# Patient Record
Sex: Female | Born: 1955 | Race: White | Hispanic: Yes | Marital: Single | Smoking: Never smoker
Health system: Southern US, Community
[De-identification: ages and names within clinical notes are randomized; demographics above are authoritative.]

## PROBLEM LIST (undated history)

## (undated) DIAGNOSIS — E785 Hyperlipidemia, unspecified: Secondary | ICD-10-CM

## (undated) DIAGNOSIS — I729 Aneurysm of unspecified site: Secondary | ICD-10-CM

## (undated) DIAGNOSIS — J45909 Unspecified asthma, uncomplicated: Secondary | ICD-10-CM

## (undated) DIAGNOSIS — E079 Disorder of thyroid, unspecified: Secondary | ICD-10-CM

## (undated) HISTORY — PX: THYROIDECTOMY: SHX17

## (undated) HISTORY — DX: Unspecified asthma, uncomplicated: J45.909

## (undated) HISTORY — PX: ABDOMINAL HYSTERECTOMY: SHX81

## (undated) HISTORY — PX: BLADDER SURGERY: SHX569

## (undated) HISTORY — DX: Hyperlipidemia, unspecified: E78.5

## (undated) HISTORY — DX: Disorder of thyroid, unspecified: E07.9

## (undated) HISTORY — DX: Aneurysm of unspecified site: I72.9

---

## 2004-08-29 ENCOUNTER — Ambulatory Visit: Payer: Self-pay | Admitting: Family Medicine

## 2004-08-30 ENCOUNTER — Ambulatory Visit (HOSPITAL_COMMUNITY): Admission: RE | Admit: 2004-08-30 | Discharge: 2004-08-30 | Payer: Self-pay | Admitting: Family Medicine

## 2004-09-26 ENCOUNTER — Ambulatory Visit: Payer: Self-pay | Admitting: Family Medicine

## 2004-10-09 ENCOUNTER — Ambulatory Visit: Payer: Self-pay | Admitting: *Deleted

## 2004-11-20 ENCOUNTER — Encounter (INDEPENDENT_AMBULATORY_CARE_PROVIDER_SITE_OTHER): Payer: Self-pay | Admitting: Family Medicine

## 2004-11-20 LAB — CONVERTED CEMR LAB: Pap Smear: NORMAL

## 2004-11-29 ENCOUNTER — Ambulatory Visit: Payer: Self-pay | Admitting: Family Medicine

## 2004-12-10 ENCOUNTER — Ambulatory Visit (HOSPITAL_COMMUNITY): Admission: RE | Admit: 2004-12-10 | Discharge: 2004-12-10 | Payer: Self-pay | Admitting: Family Medicine

## 2004-12-11 ENCOUNTER — Ambulatory Visit (HOSPITAL_COMMUNITY): Admission: RE | Admit: 2004-12-11 | Discharge: 2004-12-11 | Payer: Self-pay | Admitting: Family Medicine

## 2005-01-01 ENCOUNTER — Ambulatory Visit: Payer: Self-pay | Admitting: Family Medicine

## 2005-06-18 ENCOUNTER — Ambulatory Visit: Payer: Self-pay | Admitting: Family Medicine

## 2005-06-27 ENCOUNTER — Ambulatory Visit: Payer: Self-pay | Admitting: Internal Medicine

## 2005-07-17 ENCOUNTER — Ambulatory Visit: Payer: Self-pay | Admitting: Obstetrics and Gynecology

## 2005-07-24 ENCOUNTER — Ambulatory Visit: Payer: Self-pay | Admitting: Obstetrics and Gynecology

## 2005-07-24 ENCOUNTER — Encounter (INDEPENDENT_AMBULATORY_CARE_PROVIDER_SITE_OTHER): Payer: Self-pay | Admitting: *Deleted

## 2005-07-24 ENCOUNTER — Ambulatory Visit (HOSPITAL_COMMUNITY): Admission: RE | Admit: 2005-07-24 | Discharge: 2005-07-24 | Payer: Self-pay | Admitting: Obstetrics and Gynecology

## 2005-08-14 ENCOUNTER — Ambulatory Visit: Payer: Self-pay | Admitting: *Deleted

## 2005-08-14 ENCOUNTER — Ambulatory Visit: Payer: Self-pay | Admitting: Family Medicine

## 2005-09-18 ENCOUNTER — Ambulatory Visit: Payer: Self-pay | Admitting: Family Medicine

## 2006-02-24 ENCOUNTER — Ambulatory Visit: Payer: Self-pay | Admitting: Family Medicine

## 2006-03-06 ENCOUNTER — Ambulatory Visit (HOSPITAL_COMMUNITY): Admission: RE | Admit: 2006-03-06 | Discharge: 2006-03-06 | Payer: Self-pay | Admitting: Family Medicine

## 2006-04-03 ENCOUNTER — Ambulatory Visit (HOSPITAL_COMMUNITY): Admission: RE | Admit: 2006-04-03 | Discharge: 2006-04-03 | Payer: Self-pay | Admitting: Family Medicine

## 2006-06-16 ENCOUNTER — Ambulatory Visit: Payer: Self-pay | Admitting: Family Medicine

## 2006-06-17 ENCOUNTER — Encounter (INDEPENDENT_AMBULATORY_CARE_PROVIDER_SITE_OTHER): Payer: Self-pay | Admitting: Family Medicine

## 2006-06-17 LAB — CONVERTED CEMR LAB: TSH: 1.543 microintl units/mL

## 2006-12-09 ENCOUNTER — Encounter (INDEPENDENT_AMBULATORY_CARE_PROVIDER_SITE_OTHER): Payer: Self-pay | Admitting: Family Medicine

## 2006-12-09 DIAGNOSIS — D4959 Neoplasm of unspecified behavior of other genitourinary organ: Secondary | ICD-10-CM | POA: Insufficient documentation

## 2006-12-09 DIAGNOSIS — E039 Hypothyroidism, unspecified: Secondary | ICD-10-CM | POA: Insufficient documentation

## 2006-12-10 DIAGNOSIS — F1520 Other stimulant dependence, uncomplicated: Secondary | ICD-10-CM | POA: Insufficient documentation

## 2006-12-10 DIAGNOSIS — E8941 Symptomatic postprocedural ovarian failure: Secondary | ICD-10-CM

## 2007-01-07 ENCOUNTER — Encounter (INDEPENDENT_AMBULATORY_CARE_PROVIDER_SITE_OTHER): Payer: Self-pay | Admitting: *Deleted

## 2007-02-13 ENCOUNTER — Ambulatory Visit: Payer: Self-pay | Admitting: Family Medicine

## 2007-02-13 LAB — CONVERTED CEMR LAB
Glucose, Bld: 84 mg/dL (ref 70–99)
Potassium: 4.4 meq/L (ref 3.5–5.3)
Sodium: 142 meq/L (ref 135–145)
TSH: 6.211 microintl units/mL — ABNORMAL HIGH (ref 0.350–5.50)

## 2007-04-23 HISTORY — PX: CYSTOCELE REPAIR: SHX163

## 2007-04-27 ENCOUNTER — Ambulatory Visit: Payer: Self-pay | Admitting: Family Medicine

## 2007-04-27 LAB — CONVERTED CEMR LAB: TSH: 1.898 microintl units/mL (ref 0.350–5.50)

## 2008-03-24 ENCOUNTER — Ambulatory Visit: Payer: Self-pay | Admitting: Family Medicine

## 2008-03-24 LAB — CONVERTED CEMR LAB
ALT: 21 units/L (ref 0–35)
AST: 18 units/L (ref 0–37)
Albumin: 4 g/dL (ref 3.5–5.2)
CO2: 23 meq/L (ref 19–32)
Calcium: 9.1 mg/dL (ref 8.4–10.5)
Cholesterol: 204 mg/dL — ABNORMAL HIGH (ref 0–200)
Creatinine, Ser: 0.5 mg/dL (ref 0.40–1.20)
Free T4: 1.77 ng/dL (ref 0.89–1.80)
TSH: 0.082 microintl units/mL — ABNORMAL LOW (ref 0.350–4.50)
Total Bilirubin: 0.5 mg/dL (ref 0.3–1.2)
Triglycerides: 82 mg/dL (ref <150)
VLDL: 16 mg/dL (ref 0–40)
Vit D, 1,25-Dihydroxy: 33 (ref 30–89)

## 2008-05-31 ENCOUNTER — Ambulatory Visit: Payer: Self-pay | Admitting: Family Medicine

## 2008-06-03 ENCOUNTER — Ambulatory Visit (HOSPITAL_COMMUNITY): Admission: RE | Admit: 2008-06-03 | Discharge: 2008-06-03 | Payer: Self-pay | Admitting: Internal Medicine

## 2008-09-06 ENCOUNTER — Ambulatory Visit: Payer: Self-pay | Admitting: Family Medicine

## 2009-01-30 ENCOUNTER — Ambulatory Visit: Payer: Self-pay | Admitting: Family Medicine

## 2009-02-24 ENCOUNTER — Ambulatory Visit: Payer: Self-pay | Admitting: Family Medicine

## 2009-03-14 ENCOUNTER — Ambulatory Visit: Payer: Self-pay | Admitting: Internal Medicine

## 2009-04-20 ENCOUNTER — Ambulatory Visit: Payer: Self-pay | Admitting: Family Medicine

## 2009-05-17 ENCOUNTER — Ambulatory Visit: Payer: Self-pay | Admitting: Obstetrics and Gynecology

## 2009-05-31 ENCOUNTER — Ambulatory Visit: Payer: Self-pay | Admitting: Obstetrics and Gynecology

## 2009-05-31 ENCOUNTER — Encounter: Payer: Self-pay | Admitting: Obstetrics & Gynecology

## 2009-05-31 LAB — CONVERTED CEMR LAB
BUN: 18 mg/dL (ref 6–23)
Creatinine, Ser: 0.59 mg/dL (ref 0.40–1.20)

## 2009-06-02 ENCOUNTER — Ambulatory Visit (HOSPITAL_COMMUNITY): Admission: RE | Admit: 2009-06-02 | Discharge: 2009-06-02 | Payer: Self-pay | Admitting: Obstetrics & Gynecology

## 2009-06-28 ENCOUNTER — Ambulatory Visit: Payer: Self-pay | Admitting: Obstetrics & Gynecology

## 2009-07-17 ENCOUNTER — Ambulatory Visit: Payer: Self-pay | Admitting: Family Medicine

## 2009-07-17 LAB — CONVERTED CEMR LAB
Alkaline Phosphatase: 78 units/L (ref 39–117)
Chloride: 104 meq/L (ref 96–112)
Cholesterol: 232 mg/dL — ABNORMAL HIGH (ref 0–200)
Creatinine, Ser: 0.52 mg/dL (ref 0.40–1.20)
Free T4: 1.23 ng/dL (ref 0.80–1.80)
HDL: 58 mg/dL (ref >39)
TSH: 0.525 microintl units/mL (ref 0.350–4.500)
Total CHOL/HDL Ratio: 4
Triglycerides: 92 mg/dL (ref <150)
VLDL: 18 mg/dL (ref 0–40)

## 2009-08-02 ENCOUNTER — Ambulatory Visit: Payer: Self-pay | Admitting: Obstetrics and Gynecology

## 2009-08-09 ENCOUNTER — Ambulatory Visit (HOSPITAL_COMMUNITY): Admission: RE | Admit: 2009-08-09 | Discharge: 2009-08-09 | Payer: Self-pay | Admitting: Internal Medicine

## 2009-09-27 ENCOUNTER — Ambulatory Visit: Payer: Self-pay | Admitting: Internal Medicine

## 2009-09-27 ENCOUNTER — Encounter (INDEPENDENT_AMBULATORY_CARE_PROVIDER_SITE_OTHER): Payer: Self-pay | Admitting: Adult Health

## 2009-09-27 LAB — CONVERTED CEMR LAB
AST: 19 units/L (ref 0–37)
Calcium: 9.2 mg/dL (ref 8.4–10.5)
Chloride: 106 meq/L (ref 96–112)
Cholesterol: 143 mg/dL (ref 0–200)
Creatinine, Ser: 0.49 mg/dL (ref 0.40–1.20)
LDL Cholesterol: 67 mg/dL (ref 0–99)
Sodium: 138 meq/L (ref 135–145)
VLDL: 19 mg/dL (ref 0–40)

## 2009-11-02 ENCOUNTER — Ambulatory Visit: Payer: Self-pay | Admitting: Obstetrics & Gynecology

## 2010-08-27 ENCOUNTER — Other Ambulatory Visit (HOSPITAL_COMMUNITY): Payer: Self-pay | Admitting: Family Medicine

## 2010-08-27 DIAGNOSIS — Z1231 Encounter for screening mammogram for malignant neoplasm of breast: Secondary | ICD-10-CM

## 2010-09-05 ENCOUNTER — Ambulatory Visit (HOSPITAL_COMMUNITY)
Admission: RE | Admit: 2010-09-05 | Discharge: 2010-09-05 | Disposition: A | Discharge status: Home / Self Care | Payer: Self-pay | Source: Ambulatory Visit | Attending: Family Medicine | Admitting: Family Medicine

## 2010-09-05 ENCOUNTER — Other Ambulatory Visit (HOSPITAL_COMMUNITY): Payer: Self-pay | Admitting: Family Medicine

## 2010-09-05 DIAGNOSIS — Z1231 Encounter for screening mammogram for malignant neoplasm of breast: Secondary | ICD-10-CM | POA: Insufficient documentation

## 2010-09-05 DIAGNOSIS — M542 Cervicalgia: Secondary | ICD-10-CM

## 2010-09-07 NOTE — Group Therapy Note (Signed)
Sara Haas, Sara Haas NO.:  [id number]   MEDICAL RECORD NO.:  [id number]          PATIENT TYPE:  WOC   LOCATION:  [facility]                   FACILITY:  [facility]   PHYSICIAN:  Ellis Parents, MD    DATE OF BIRTH:  02/25/1956   DATE OF SERVICE:  08/14/2005                                    CLINIC NOTE   HISTORY OF PRESENT ILLNESS:  This patient is returning for followup.  The  patient has a mesh in the vagina between the vagina and the posterior  bladder wall that was placed there in February of 2001 for intractable  urinary stress incontinence.  The mesh has eroded into the vagina.  The  patient was seen by Dr. Okey Dupre on July 18, 2005, on July 24, 2005 was taken  to the operating room and an effort was made to remove the mesh.  This was  not performed, but a biopsy of the mesh was performed.  The pathology report  was gauze material, clinically foreign body.  The patient was seen by a  urologist and was not cystoscoped but given antibiotics.  We have no current  record at this time.  She is much improved, with significant decrease in  pelvic discomfort.  The patient is to be seen for followup by a urologist,  and the method of management, of course, will be his decision.           ______________________________  Ellis Parents, MD     SA/MEDQ  D:  08/14/2005  T:  08/15/2005  Job:  [id number]

## 2010-09-07 NOTE — Op Note (Signed)
[name], Sara Haas         ACCOUNT NO.:  [id number]   MEDICAL RECORD NO.:  [id number]          PATIENT TYPE:  AMB   LOCATION:  [facility]                           [facility]   PHYSICIAN:  Phil D. Okey Dupre, M.D.     DATE OF BIRTH:  [birth year]   DATE OF PROCEDURE:  07/24/2005  DATE OF DISCHARGE:                                 OPERATIVE REPORT   PROCEDURE:  Pelvic examination under anesthesia and partial removal of the  vaginal wall foreign body.  Intravenous injection of and indigo carmine and  vaginal packing.   PREOPERATIVE DIAGNOSIS:  Foreign body, apex of vagina unable to remove in  clinic and urinary incontinence.   POSTOPERATIVE DIAGNOSIS:  Foreign body, apex of vagina unable to remove in  clinic and urinary incontinence.  Probable mesh from previous bladder  surgery.   SURGEON:  Dr. Elinor Dodge.   ESTIMATED BLOOD LOSS:  Minimal.   POSTOPERATIVE CONDITION:  Satisfactory.   OPERATIVE FINDINGS:  At the apex of the vagina there was a foul foreign body  that resembled fabric of some type.  It seemed to be sutured up there as we  could see sutures and seemed to enter a small opening at the apex of the  vagina.  The vaginal wall was quite atrophic as was the introitus.  This  patient had undergone previous total abdominal hysterectomy, bilateral  salpingo-oophorectomy and bladder repair so I expect this was probably part  of the mesh used during the secondary procedure several years ago.   Under satisfactory general anesthesia the patient in dorsolithotomy  position, perineum, vagina prepped and draped in usual sterile manner.  Bimanual pelvic examination under anesthesia revealed a palpable mass at the  apex of the vagina, but no induration could be palpable beyond that area.  Weighted speculum was placed posterior fourchette of the vagina but this did  not allow good visualization of the foreign body, so a Graves open-sided  speculum was placed in there which was able to  isolate and easily visualize  the area.  Traction on the foreign body failed to release it, so with using  a long Metzenbaum scissors and holding the base taut, with a ring forceps,  the foreign body was cut away from the apex of the vagina.  Small residual  remained which was trimmed off and I am that there is still residual tissue  up above this; however, I was afraid of getting into the bladder which may  already be fistulized based on the patient's incontinence.  The area was  observed.  No significant bleeding was noted and I had the anesthesiologist  inject indigo carmine and packed the vagina with a gauze packing to be  removed in the recovery room to see if there is any staining and sign of  fistulization.  The patient was transferred to recovery room in satisfactory  condition.           ______________________________  Javier Glazier Okey Dupre, M.D.    PDR/MEDQ  D:  07/24/2005  T:  07/25/2005  Job:  [id number]

## 2010-09-07 NOTE — Group Therapy Note (Signed)
NAME:  Sara Haas, BOSS NO.:  [id number]   MEDICAL RECORD NO.:  [id number]          PATIENT TYPE:  WOC   LOCATION:  [facility]                   FACILITY:  [facility]   PHYSICIAN:  Argentina Donovan, MD        DATE OF BIRTH:  1955/09/21   DATE OF SERVICE:                                    CLINIC NOTE   The patient is a 55 year old Hispanic female that went through a total  abdominal hysterectomy, bilateral salpingo-oophorectomy and bladder repair  around 2000.  She had no examinations from that time, up until she was seen  by [facility] in February 2007, at which time they noticed something high  in the vagina.  Her chief complaint was pelvic pressure.  They got and  ultrasound, which virtually normal status post hysterectomy and found, in  the vagina, a white mass that they could not remove and sent her in to  ultrasound.   EXAMINATION:  The patient, on examination at the apex of the vagina, has a  material perhaps cloth-like mass with rounded edges like a condom, which  seems to be tacked to the apex of the vagina or in the cuff and growing  through the vagina at the apex.  It was unable, because of the attachment  and pain it caused the patient, to remove this in the clinic.  We are,  therefore, scheduling her for a removal in the operating room.   IMPRESSION:  Foreign body, apex of the vagina.           ______________________________  Argentina Donovan, MD     PR/MEDQ  D:  07/17/2005  T:  07/18/2005  Job:  [id number]

## 2010-09-10 ENCOUNTER — Ambulatory Visit (HOSPITAL_COMMUNITY)
Admission: RE | Admit: 2010-09-10 | Discharge: 2010-09-10 | Disposition: A | Discharge status: Home / Self Care | Payer: Self-pay | Source: Ambulatory Visit | Attending: Family Medicine | Admitting: Family Medicine

## 2010-09-10 DIAGNOSIS — I728 Aneurysm of other specified arteries: Secondary | ICD-10-CM | POA: Insufficient documentation

## 2010-09-10 DIAGNOSIS — R259 Unspecified abnormal involuntary movements: Secondary | ICD-10-CM | POA: Insufficient documentation

## 2010-09-10 DIAGNOSIS — R209 Unspecified disturbances of skin sensation: Secondary | ICD-10-CM | POA: Insufficient documentation

## 2010-09-10 DIAGNOSIS — M542 Cervicalgia: Secondary | ICD-10-CM | POA: Insufficient documentation

## 2010-09-10 DIAGNOSIS — R51 Headache: Secondary | ICD-10-CM | POA: Insufficient documentation

## 2010-09-11 ENCOUNTER — Other Ambulatory Visit (HOSPITAL_COMMUNITY): Payer: Self-pay | Admitting: Family Medicine

## 2010-09-14 ENCOUNTER — Other Ambulatory Visit (HOSPITAL_COMMUNITY): Payer: Self-pay

## 2010-09-14 ENCOUNTER — Inpatient Hospital Stay (HOSPITAL_COMMUNITY): Admission: RE | Admit: 2010-09-14 | Payer: Self-pay | Source: Ambulatory Visit

## 2012-03-25 ENCOUNTER — Ambulatory Visit: Payer: Self-pay | Admitting: Family Medicine

## 2012-03-25 ENCOUNTER — Encounter: Payer: Self-pay | Admitting: Family Medicine

## 2012-03-25 ENCOUNTER — Ambulatory Visit (INDEPENDENT_AMBULATORY_CARE_PROVIDER_SITE_OTHER): Payer: Self-pay | Admitting: Family Medicine

## 2012-03-25 VITALS — BP 124/77 | HR 49 | Temp 98.1°F | Ht <= 58 in | Wt 135.1 lb

## 2012-03-25 DIAGNOSIS — J45909 Unspecified asthma, uncomplicated: Secondary | ICD-10-CM

## 2012-03-25 DIAGNOSIS — E039 Hypothyroidism, unspecified: Secondary | ICD-10-CM

## 2012-03-25 DIAGNOSIS — E785 Hyperlipidemia, unspecified: Secondary | ICD-10-CM

## 2012-03-25 MED ORDER — PRAVASTATIN SODIUM 20 MG PO TABS: 20.0000 mg | ORAL_TABLET | Freq: Every day | ORAL | Status: DC

## 2012-03-25 MED ORDER — ALBUTEROL SULFATE HFA 108 (90 BASE) MCG/ACT IN AERS: 2.0000 | INHALATION_SPRAY | Freq: Four times a day (QID) | RESPIRATORY_TRACT | Status: DC | PRN

## 2012-03-25 MED ORDER — LEVOTHYROXINE SODIUM 50 MCG PO TABS: 50.0000 ug | ORAL_TABLET | Freq: Every day | ORAL | Status: DC

## 2012-03-25 NOTE — Patient Instructions (Addendum)
It was good to meet you today.  I have sent in refills for all of your medicine.  Make sure to see Ms. [name], then come back to see me after that.    Have a good Christmas!

## 2012-03-25 NOTE — Progress Notes (Signed)
Patient ID: Sara Haas, female   DOB: September 26, 1955, 56 y.o.   MRN: [id number] Sara Haas is a 56 y.o. female who presents to [facility] today for to establish care.  Previously a [facility] patient, has not been seen there for about 5 months, last visit was several months before they closed.  She has the chronic medical conditions listed below but has no concerns or issues for today:  1.  Hypothyroid:  Has been on 50 mcg Synthroid for several months.  Was on 75 mcg but had this reduced after testing.  Has not had follow up testing since then.  No heat/cold intolerance.  No palpitations or edema noted.    2.   HLD:  Last lipid panel listed below.    Currently is on Statin, stable dose for years.  Denies any myalgias, icterus, jaundice.  Tolerating medications well.  Has not had any follow-up testing since 2011. Lab Results  Component Value Date   CHOL 143 09/27/2009   CHOL 232* 07/17/2009   CHOL 204* 03/24/2008   Lab Results  Component Value Date   HDL 57 09/27/2009   HDL 58 07/17/2009   HDL 60 52/11/4130   Lab Results  Component Value Date   LDLCALC 67 09/27/2009   LDLCALC 156* 07/17/2009   LDLCALC 128* 03/24/2008   Lab Results  Component Value Date   TRIG 93 09/27/2009   TRIG 92 07/17/2009   TRIG 82 03/24/2008   Lab Results  Component Value Date   CHOLHDL 2.5 Ratio 09/27/2009   CHOLHDL 4.0 Ratio 07/17/2009   CHOLHDL 3.4 Ratio 03/24/2008    3.  Asthma:  Using inhaler more often in past month.  Last time she used inhaler was probably over a year ago.  She notes that it actually expired in 2011.  Uses it once a night 2-3 times a week for past month.  No URI symptoms, occasional cough at night relieved by Albuterol.  No history of smoking.    The following portions of the patient's history were reviewed and updated as appropriate: allergies, current medications, past medical history, family and social history, and problem list.  Patient is a nonsmoker.  Past Medical History  Diagnosis  Date  . Thyroid disease     hypothyroidism  . Hyperlipidemia     ROS as above otherwise neg. No Chest pain, palpitations, SOB, Fever, Chills, Abd pain, N/V/D.  Medications reviewed. No current outpatient prescriptions on file.    Exam:  BP 124/77  Pulse 49  Temp 98.1 F (36.7 C) (Oral)  Ht 4\' 10"  (1.473 m)  Wt 135 lb 1.6 oz (61.281 kg)  BMI 28.24 kg/m2 Gen: Well NAD HEENT:  [place]/AT.  EOMI, PERRL.  MMM, tonsils non-erythematous, non-edematous.  External ears WNL, Bilateral TM's normal without retraction, redness or bulging.  Neck:  Trachea midline, no thyromegaly.  Lungs: CTABL Nl WOB Cardiac:  Regular rate and rhythm without murmur auscultated.  Good S1/S2. Abd: NABS, NT, ND Exts: Non edematous BL  LE, warm and well perfused.  Psych:  Pleasant, conversant    No results found for this or any previous visit (from the past 72 hour(s)).

## 2012-03-26 DIAGNOSIS — E785 Hyperlipidemia, unspecified: Secondary | ICD-10-CM | POA: Insufficient documentation

## 2012-03-26 NOTE — Assessment & Plan Note (Signed)
Recent mild exacerbation.   She cannot afford a controller medication at this time. Will follow up next visit regarding this once she has [employer] and MAP program for controller.

## 2012-03-26 NOTE — Assessment & Plan Note (Signed)
Provided short term refills until she can obtain [employer] Card TSH testing at that time to see if we need to make adjustments. Asymptomatic currently.

## 2012-10-21 ENCOUNTER — Ambulatory Visit (INDEPENDENT_AMBULATORY_CARE_PROVIDER_SITE_OTHER): Payer: No Typology Code available for payment source | Admitting: Family Medicine

## 2012-10-21 ENCOUNTER — Encounter: Payer: Self-pay | Admitting: Family Medicine

## 2012-10-21 VITALS — BP 109/68 | HR 78 | Temp 97.3°F | Ht <= 58 in | Wt 134.1 lb

## 2012-10-21 DIAGNOSIS — N8111 Cystocele, midline: Secondary | ICD-10-CM

## 2012-10-21 DIAGNOSIS — N8110 Cystocele, unspecified: Secondary | ICD-10-CM | POA: Insufficient documentation

## 2012-10-21 DIAGNOSIS — R109 Unspecified abdominal pain: Secondary | ICD-10-CM

## 2012-10-21 DIAGNOSIS — N3946 Mixed incontinence: Secondary | ICD-10-CM

## 2012-10-21 LAB — CBC WITH DIFFERENTIAL/PLATELET
Eosinophils Absolute: 0.1 10*3/uL (ref 0.0–0.7)
Eosinophils Relative: 2 % (ref 0–5)
HCT: 38.3 % (ref 36.0–46.0)
Lymphocytes Relative: 33 % (ref 12–46)
Lymphs Abs: 2.7 10*3/uL (ref 0.7–4.0)
MCH: 30 pg (ref 26.0–34.0)
MCV: 89.1 fL (ref 78.0–100.0)
Monocytes Absolute: 0.4 10*3/uL (ref 0.1–1.0)
Monocytes Relative: 5 % (ref 3–12)
Platelets: 254 10*3/uL (ref 150–400)
RBC: 4.3 MIL/uL (ref 3.87–5.11)
WBC: 8 10*3/uL (ref 4.0–10.5)

## 2012-10-21 LAB — POCT URINALYSIS DIPSTICK
Blood, UA: NEGATIVE
Glucose, UA: NEGATIVE
Nitrite, UA: POSITIVE
Protein, UA: 100
Urobilinogen, UA: 1

## 2012-10-21 LAB — POCT UA - MICROSCOPIC ONLY

## 2012-10-21 NOTE — Progress Notes (Signed)
Subjective:     Patient ID: Sara Haas, female   DOB: 1955-09-30, 57 y.o.   MRN: [id number]  HPI Abdominal pain: Patient has had a chronic pain for years in her abdominal wall. She was seen many years ago by urologist and Dr. Okey Dupre. Dr. Okey Dupre removed most of the bladder mesh, that was believed to cause her the pain, but not all do to fear of fistulization into the bladder at that time. She also reports that her urologist wanted to perform a surgery, but needed a surgeon to repair her abdominal/incisional hernia at the same time. 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 She comes today with increased pain upon sitting a length of time and on standing feels like her bladder is at her introitus. She is having frequency. She experiences incontinence with sneezing, coughing and laughing. She also has urge incontinence and can not make it to bathroom. She has had a hysterectomy 12 years ago. Her pain is 8/10 when it occurs. She currently is not in any pain. She denies fever, and states her pain is suprapubic only (around scar).   Review of Systems See above HPI    Objective:   Physical Exam BP 109/68  Pulse 78  Temp(Src) 97.3 F (36.3 C) (Oral)  Ht 4' 9.75" (1.467 m)  Wt 134 lb 1.6 oz (60.827 kg)  BMI 28.26 kg/m2 Gen: Pleasant female. Looks older than stated age. Does not speak [place], daughter translates. CV: RRR ABD: Soft. Herniations felt through scar area. Mildly tender over scarred area and suprapubic area. Upper abdomen NTND. BS present and normal.  GU: Bladder visualized at introitus opening without valsalva.

## 2012-10-21 NOTE — Patient Instructions (Signed)
Prolapso  (Prolapse) Prolapso es un trmino que se refiere al desprendimiento, abultamiento, o cada de una parte del cuerpo. Los rganos que con ms frecuencia sufren prolapso son el recto, el intestino delgado, la vejiga, la uretra, la vagina (canal de parto), el tero y el cuello del tero. Un prolapso ocurre cuando los ligamentos y el tejido muscular que rodea al recto, la vejiga y el tero se daan o se debilitan. CAUSAS Ocurre especialmente con  El parto. Algunas mujeres sienten presin plvica o tienen problemas para contener la orina luego del parto, debido al estiramiento y desgarro de los tejidos plvicos. Esto generalmente mejora con el tiempo y la sensacin a menudo desaparece, pero puede volver al envejecer.  Levantar pesos excesivos de manera habitual.  La edad.  En la menopausia, con la disminucin de la produccin de estrgenos, se debilitan los ligamentos y los msculos de la pelvis.  Cirugas anteriores de la pelvis.  Obesidad.  Constipacin crnica.  Tos crnica. Un prolapso puede afectar un nico rgano, o bien varios rganos pueden sufrir un prolapso al mismo tiempo. La pared anterior de la vagina sostiene la vejiga. La pared posterior sostiene parte del recto. El tero ocupa un lugar en el medio. Todos estos rganos pueden verse involucrados cuando los ligamentos y los msculos que rodean la vagina se relajan demasiado. Esto a menudo empeora cuando la mujer deja de producir estrgeno (menopausia). SNTOMAS  No poder controlar la salida de orina (incontinencia) al toser, estornudar, realizar esfuerzos y ejercicio.  Necesitar realizar mucha fuerza en un movimiento intestinal debido a que la materia fecal est retenida.  Cuando una parte de un rgano sobresale a travs de la apertura de la vagina, a veces existe una sensacin de pesadez o presin. Puede sentirse como que algo se est cayendo. Esta sensacin aumenta al toser o contraer los msculos.  Si el rgano  sobresale por la apertura de la vagina y roza contra la ropa, puede producirse irritacin, lceras, infeccin, dolor y sangrado.  Dolor en la cintura.  Presin en la zona superior o inferior de la vagina, prdida de orina o de materia fecal.  Problemas durante las relaciones sexuales.  Imposibilidad de insertar un tampn o el aplicador. DIAGNSTICO A menudo un examen fsico es todo lo que se necesita para identificar el problema. Durante el examen se le pedir que tosa y contraiga los msculos mientras est recostada, sentada y parada. El profesional determinar si se deben realizar ms exmenes, como por ejemplo, para verificar del funcionamiento de su vejiga. Algunos diagnsticos son:  Cistocele: abultamiento y cada de la vejiga dentro de la parte superior de la vagina.  Rectocele: parte del recto se abulta hacia dentro de la vagina.  Prolapso del tero: el tero cae hacia la vagina.  Enterocele Abultamiento en la zona superior de la vagina, luego de una histerectoma (remocin del tero), en el que el intestino delgado protruye en la vagina. Hernia en la parte superior de la vagina.  Uretrocele: La uretra (conducto por el que pasa la orina) protruye en la zona de la vagina. TRATAMIENTO En la mayor parte de los casos, slo se debe tratar un prolapso si produce sntomas. Si los sntomas interfieren en sus actividades diarias o en las relaciones sexuales, ser necesario realizar un tratamiento. A continuacin se indican algunas medidas que puede utilizar para tratar el prolapso.  El estrgeno puede ayudar a las mujeres mayores con prolapsos leves.  Los ejercicios de Kegel pueden ser tiles para solucionar casos leves de   prolapso al fortalecer y tensar los msculos del piso plvico.  Se utilizan pesarios en mujeres que no pueden o eligen no realizarse una ciruga. Un pesario es una pieza de plstico o goma con forma de rosca que se coloca en la vagina para mantener a los rganos en su  lugar. El pesario debe ser colocado por el profesional, quien adems le dar las instrucciones pertinentes relacionadas con el dispositivo. Si funciona bien en usted, puede ser el nico tratamiento que se necesite.  A menudo la ciruga es la nica forma de tratamiento para prolapsos ms graves. Existen distintos tipos de ciruga disponibles, y deber conversar con el profesional acerca de cul es el mejor para usted. Si el tero ha sufrido un prolapso, es posible que sea removido (histerectoma) como parte del procedimiento quirrgico. El profesional le comentar acerca de los riesgos y beneficios.  Podr realizarse una suspensin utero-vaginal (ciruga para sostener los rganos), especialmente si quiere cuidar su fertilidad. Ninguna forma de tratamiento garantiza la correccin del prolapso o el alivio de los sntomas. INSTRUCCIONES PARA EL CUIDADO DOMICILIARIO:  Utilice una almohadilla sanitaria o algn producto absorbente si tiene incontinencia urinaria.  Evite levantar pesos y los deportes o trabajos extenuantes.  Tome analgsicos de venta libre para las molestias leves.  Tome estrgenos o utilcelos en forma de crema vaginal.  Haga los ejercicios de Kegel o utilice un pesario antes de decidirse por la ciruga.  Haga los ejercicios de Kegel despus de tener un beb. SOLICITE ATENCIN MDICA SI:  Los sntomas interfieren con sus actividades diarias.  Necesita medicamentos para aliviar las molestias.  Debe colocarse un pesario.  Nota un sangrado que proviene de la vagina.  Cree que tiene lceras en el cuello del tero.  La temperatura oral se eleva sin motivo por arriba de 102 F (38.9 C).  Siente dolor al orinar u orina con sangre.  Presenta sangrado al realizar un movimiento intestinal.  Los sntomas interfieren con su vida sexual.  Tiene incontinencia urinaria y sta interfiere en sus actividades diarias.  Pierde orina durante las relaciones sexuales.  Tiene tos  crnica.  Sufre estreimiento crnico. Document Released: 07/25/2008 Document Revised: 07/01/2011 ExitCare Patient Information 2014 [employer].  

## 2012-10-23 MED ORDER — CEPHALEXIN 500 MG PO CAPS: 500.0000 mg | ORAL_CAPSULE | Freq: Three times a day (TID) | ORAL | Status: DC

## 2012-10-23 NOTE — Assessment & Plan Note (Addendum)
-   Pt. With clinical diagnosis of mixed incontinence of stress and urge. - UA today was loaded with bacteria and nitrites, sent for culture. Will call patient with results and prescribed antibiotic if neccessary. 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  - Patient maybe should attempt to qualify for medicaid.  - F/U: PCP in 2 weeks

## 2012-10-23 NOTE — Assessment & Plan Note (Addendum)
-   Urology and Gynecology referral ordered today. Patient is an orange card holder and this has been problematic in her getting surgical care in the past. Unfortunately her condition is beyond the scope of primary care and she needs surgical intervention. Uncertain if patient would qualify for medicaid, but this maybe her best option.  - UA went for urine culture, will follow through with patient.

## 2012-10-23 NOTE — Addendum Note (Signed)
Addended by: Felix Pacini A on: 10/23/2012 04:08 PM   Modules accepted: Orders

## 2012-10-24 ENCOUNTER — Telehealth: Payer: Self-pay | Admitting: Family Medicine

## 2012-10-24 LAB — URINE CULTURE

## 2012-10-24 NOTE — Telephone Encounter (Signed)
Attempted to call home patient's home, unfortunately no one spoke [place]. Called second line and spoke with daughter and she is understanding of UA culture results. She will pick up antibiotic today.

## 2012-11-02 ENCOUNTER — Other Ambulatory Visit: Payer: Self-pay | Admitting: Family Medicine

## 2012-11-11 ENCOUNTER — Ambulatory Visit: Payer: No Typology Code available for payment source | Admitting: Family Medicine

## 2012-11-23 ENCOUNTER — Ambulatory Visit (INDEPENDENT_AMBULATORY_CARE_PROVIDER_SITE_OTHER): Payer: No Typology Code available for payment source | Admitting: Family Medicine

## 2012-11-23 VITALS — BP 115/72 | HR 75 | Temp 98.9°F | Ht 59.0 in | Wt 128.0 lb

## 2012-11-23 DIAGNOSIS — N8110 Cystocele, unspecified: Secondary | ICD-10-CM

## 2012-11-23 DIAGNOSIS — N8111 Cystocele, midline: Secondary | ICD-10-CM

## 2012-11-23 NOTE — Progress Notes (Signed)
Subjective:     Patient ID: Sara Haas, female   DOB: May 02, 1955, 57 y.o.   MRN: [id number]  HPI UTI f/u: Patient is here for a follow up to her UTI and ambulatory referrals to gynecology and urology. Her daughter is present and interprets for her today. She reports she is feeling a little better since we treated her UTI. Her abdominal pain that has been chronic for her is still the same. Per epic she has an appointment with Sara Haas on the 13th and the patient reports she also has a urology appointment with [facility] on the 14th. Unfortunately her [employer] card is about to expire at th end of this month. Discussed with Sara Haas today, and hopefully the patient will be able to get her card renewed in a timely manner that will not effect the outcomes of the referrals set in place. Patient is still with abdominal pain and urinary incontinence.   Review of Systems Negative, with the exception of above mentioned in HPI    Objective:   Physical Exam BP 115/72  Pulse 75  Temp(Src) 98.9 F (37.2 C) (Oral)  Ht 4\' 11"  (1.499 m)  Wt 128 lb (58.06 kg)  BMI 25.84 kg/m2 Gen: NAD. Pleasant spanish speaking female. CV: RRR  ABD: Soft. Herniations felt through scar area. Mildly tender over scarred area only. Upper abdomen NTND. BS present and normal.

## 2012-11-23 NOTE — Assessment & Plan Note (Addendum)
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 - again, patient's condition  is outside the scope of primary care.  - Orange card must be renewed, expires at the end of this month. Patients daughter aware of the problem and currently is getting everything in order. - Medicaid is not an option for this patient

## 2012-11-23 NOTE — Patient Instructions (Addendum)
[facility] on August 13 th at 2:45 [address] [place], [place] [address]  [phone]  Please call about renewing your orange card.

## 2012-12-02 ENCOUNTER — Ambulatory Visit (INDEPENDENT_AMBULATORY_CARE_PROVIDER_SITE_OTHER): Payer: No Typology Code available for payment source | Admitting: Obstetrics & Gynecology

## 2012-12-02 ENCOUNTER — Encounter: Payer: Self-pay | Admitting: Obstetrics & Gynecology

## 2012-12-02 VITALS — BP 127/85 | HR 67 | Temp 97.0°F | Ht <= 58 in | Wt 129.3 lb

## 2012-12-02 DIAGNOSIS — N993 Prolapse of vaginal vault after hysterectomy: Secondary | ICD-10-CM

## 2012-12-02 DIAGNOSIS — K432 Incisional hernia without obstruction or gangrene: Secondary | ICD-10-CM

## 2012-12-02 LAB — POCT URINALYSIS DIP (DEVICE)
Bilirubin Urine: NEGATIVE
Glucose, UA: NEGATIVE mg/dL
Hgb urine dipstick: NEGATIVE
Ketones, ur: NEGATIVE mg/dL
Specific Gravity, Urine: 1.01 (ref 1.005–1.030)
Urobilinogen, UA: 0.2 mg/dL (ref 0.0–1.0)

## 2012-12-02 NOTE — Patient Instructions (Addendum)
Prolapso  (Prolapse) Prolapso es un trmino que se refiere al desprendimiento, abultamiento, o cada de una parte del cuerpo. Los rganos que con ms frecuencia sufren prolapso son el recto, el intestino delgado, la vejiga, la uretra, la vagina (canal de parto), el tero y el cuello del tero. Un prolapso ocurre cuando los ligamentos y el tejido muscular que rodea al recto, la vejiga y el tero se daan o se debilitan. CAUSAS Ocurre especialmente con  [place]. Algunas mujeres sienten presin plvica o tienen problemas para contener la orina luego del [place], debido al estiramiento y Insurance account manager de los tejidos plvicos. Esto generalmente mejora con el tiempo y la sensacin a menudo desaparece, Leisure centre manager.  Levantar pesos excesivos de [place] habitual.  La edad.  En la menopausia, con la disminucin de la produccin de estrgenos, se debilitan los ligamentos y los msculos de la pelvis.  Cirugas anteriores de la pelvis.  Obesidad.  Constipacin crnica.  Tos crnica. Un prolapso puede afectar un nico rgano, o bien varios rganos pueden sufrir un prolapso al [employer]. La pared anterior de la vagina sostiene la vejiga. La pared posterior sostiene parte del recto. El tero ocupa un Producer, television/film/video. Todos estos rganos pueden verse involucrados cuando los ligamentos y los msculos que rodean la vagina se Gaffer. Esto a menudo empeora cuando la mujer deja de producir estrgeno (menopausia). SNTOMAS  No poder controlar la salida de orina (incontinencia) al toser, Engineering geologist, Education officer, environmental esfuerzos y ejercicio.  Necesitar realizar mucha fuerza en un movimiento intestinal debido a que la materia fecal est retenida.  Cuando una parte de un rgano sobresale a travs de la apertura de la vagina, a veces existe una sensacin de pesadez o presin. Puede sentirse como que algo se est cayendo. Esta sensacin aumenta al toser o [employer].  Si el rgano  sobresale por la apertura de la vagina y roza contra la ropa, puede producirse irritacin, lceras, infeccin, dolor y Landscape architect.  Dolor en la cintura.  Presin en la zona superior o inferior de la vagina, prdida de [place] o de materia fecal.  Problemas durante las [employer].  Imposibilidad de insertar un tampn o el aplicador. DIAGNSTICO A menudo un examen fsico es todo lo que se necesita para identificar el problema. Durante el examen se le pedir que tosa y [employer] msculos mientras est [place], sentada y parada. El Nurse, children's si se deben realizar ms exmenes, como por ejemplo, para verificar del funcionamiento de su vejiga. Algunos diagnsticos son:  Cistocele: abultamiento y cada de la vejiga dentro de la parte superior de la vagina.  Rectocele: parte del recto se abulta hacia dentro de la vagina.  Prolapso del tero: el tero cae hacia la vagina.  Enterocele Abultamiento en la zona superior de la vagina, luego de [place] (remocin del tero), en el que el intestino delgado protruye en la vagina. Hernia en la parte superior de la vagina.  Uretrocele: La uretra (conducto por el que pasa la orina) protruye en la zona de la vagina. TRATAMIENTO En la mayor parte de los casos, slo se debe tratar un prolapso si produce sntomas. Si los sntomas interfieren en sus actividades diarias o en las relaciones sexuales, ser necesario Pensions consultant. A continuacin se indican algunas medidas que puede utilizar para tratar el prolapso.  El estrgeno puede ayudar a las mujeres mayores con prolapsos leves.  Los ejercicios de Keg[place] pueden ser tiles para solucionar casos leves de  prolapso al fortalecer y tensar los msculos del piso plvico.  Se utilizan pesarios en mujeres que no pueden o eligen no realizarse [place]. Un pesario es una pieza de plstico o goma con forma de rosca que se coloca en la vagina para Pharmacologist a los Teacher, adult education. El pesario debe ser colocado por el profesional, quien adems le dar las instrucciones pertinentes relacionadas con el dispositivo. Si funciona bien en usted, puede ser el nico tratamiento que se necesite.  A menudo la ciruga es la nica forma de tratamiento para prolapsos ms graves. Existen distintos tipos de [place] disponibles, y deber Lobbyist con el profesional acerca de cul es el mejor para usted. Si el tero ha sufrido un prolapso, es posible que sea removido (histerectoma) como parte del procedimiento quirrgico. El profesional Art gallery manager acerca de los riesgos y beneficios.  Podr realizarse una suspensin utero-vaginal (ciruga para sostener los rganos), especialmente si quiere cuidar su fertilidad. Ninguna forma de tratamiento garantiza la correccin del prolapso o el alivio de los sntomas. INSTRUCCIONES PARA EL CUIDADO DOMICILIARIO:  Utilice una almohadilla sanitaria o algn producto absorbente si tiene incontinencia urinaria.  Evite levantar pesos y los deportes o trabajos extenuantes.  Tome analgsicos de venta libre para las molestias leves.  Tome estrgenos o utilcelos en forma de crema vaginal.  Haga los ejercicios de Kegel o utilice un pesario antes de decidirse por la [place].  Haga los ejercicios de Kegel despus de tener un beb. SOLICITE ATENCIN MDICA SI:  Los sntomas interfieren con sus actividades diarias.  Necesita medicamentos para [employer].  Debe colocarse un pesario.  Nota un sangrado que proviene de la vagina.  Cree que tiene [employer] cuello del tero.  La temperatura oral se eleva sin motivo por arriba de 102 F (38.9 C).  Siente dolor al Geographical information systems officer u [place] con [place].  Presenta sangrado al realizar un movimiento intestinal.  Los sntomas interfieren con su vida sexual.  Tiene incontinencia urinaria y sta interfiere en sus actividades diarias.  Pierde orina durante las [employer].  Tiene tos  crnica.  Sufre estreimiento crnico. Document Released: 07/25/2008 Document Revised: 07/01/2011 ExitCare Patient Information 2014 [place], [place].

## 2012-12-04 ENCOUNTER — Encounter: Payer: Self-pay | Admitting: Obstetrics & Gynecology

## 2012-12-04 NOTE — Progress Notes (Signed)
Subjective:     Patient ID: Sara Haas, female   DOB: 1955-05-17, 57 y.o.   MRN: [id number]  HPI  Pt with a h/o hyst and bladder suspension.  Pt now with c/o incontinence. Pt scheduled to see urologist in 2 days but, she was told that she needed to be seen by GYN first.  She was prev followed by Dr. Okey Dupre and Dr. Marice Potter  Past Surgical History  Procedure Laterality Date  . Cystocele repair  2009  . Abdominal hysterectomy    . Bladder surgery     Past Medical History  Diagnosis Date  . Thyroid disease     hypothyroidism  . Hyperlipidemia   . Asthma    Current Outpatient Prescriptions on File Prior to Visit  Medication Sig Dispense Refill  . albuterol (PROVENTIL HFA;VENTOLIN HFA) 108 (90 BASE) MCG/ACT inhaler Inhale 2 puffs into the lungs every 6 (six) hours as needed.  6.7 g  3  . levothyroxine (SYNTHROID, LEVOTHROID) 50 MCG tablet TAKE ONE TABLET BY MOUTH EVERY DAY  30 tablet  0  . pravastatin (PRAVACHOL) 20 MG tablet TAKE ONE TABLET BY MOUTH EVERY DAY  30 tablet  0   No current facility-administered medications on file prior to visit.    No Known Allergies History   Social History  . Marital Status: Single    Spouse Name: N/A    Number of Children: N/A  . Years of Education: N/A   Occupational History  . Not on file.   Social History Main Topics  . Smoking status: Never Smoker   . Smokeless tobacco: Never Used  . Alcohol Use: No  . Drug Use: No  . Sexual Activity: No   Other Topics Concern  . Not on file   Social History Narrative  . No narrative on file           Review of Systems     Objective:   Physical Exam BP 127/85  Pulse 67  Temp(Src) 97 F (36.1 C) (Oral)  Ht 4\' 9"  (1.448 m)  Wt 129 lb 4.8 oz (58.65 kg)  BMI 27.97 kg/m2 Pt in NAD Lungs: CTA CV: RRR Abd: soft; ND; NT; incisional hernia with ~3cm defect in transverse incision.  No incarceration GU: EGBUS: no lesions Vagina: vaginal apex prolapsed to introitus Adnexa: no masses;  NT            Assessment:     Vaginal vault prolapse and incontinence.  Pt with previous incontinence surgery.  She already has an appt       Plan:     F/u with urology as scheduled Rec general surgery eval of incisional hernia (consult not made will wait until after Urology consutl

## 2012-12-10 ENCOUNTER — Other Ambulatory Visit: Payer: Self-pay | Admitting: *Deleted

## 2012-12-11 MED ORDER — LEVOTHYROXINE SODIUM 50 MCG PO TABS: ORAL_TABLET | ORAL | Status: DC

## 2012-12-11 MED ORDER — PRAVASTATIN SODIUM 20 MG PO TABS: ORAL_TABLET | ORAL | Status: DC

## 2012-12-14 ENCOUNTER — Telehealth: Payer: Self-pay | Admitting: Family Medicine

## 2012-12-14 ENCOUNTER — Ambulatory Visit: Payer: No Typology Code available for payment source

## 2012-12-14 NOTE — Telephone Encounter (Signed)
Forward to [employer] to call spanish speaking patient, please have her come in to be evaluated for hernia, if needed she may be referred at that time.Busick, Rodena Medin

## 2012-12-14 NOTE — Telephone Encounter (Signed)
Pt's daughter stated that [name] needs a referral to a general surgeon for hernia.

## 2012-12-15 ENCOUNTER — Other Ambulatory Visit: Payer: Self-pay | Admitting: *Deleted

## 2012-12-15 NOTE — Telephone Encounter (Signed)
Pt has  an appt on 08/29 @ 11:15 with Dr. Aviva Signs   [employer]

## 2012-12-16 MED ORDER — PRAVASTATIN SODIUM 20 MG PO TABS: ORAL_TABLET | ORAL | Status: DC

## 2012-12-16 MED ORDER — LEVOTHYROXINE SODIUM 50 MCG PO TABS: ORAL_TABLET | ORAL | Status: DC

## 2012-12-16 NOTE — Telephone Encounter (Signed)
Sent to me, this is your patient I think.Marland KitchenMarland Kitchen

## 2012-12-17 ENCOUNTER — Other Ambulatory Visit: Payer: Self-pay | Admitting: Family Medicine

## 2012-12-17 MED ORDER — PRAVASTATIN SODIUM 20 MG PO TABS: ORAL_TABLET | ORAL | Status: DC

## 2012-12-17 MED ORDER — LEVOTHYROXINE SODIUM 50 MCG PO TABS: ORAL_TABLET | ORAL | Status: DC

## 2012-12-18 ENCOUNTER — Ambulatory Visit (INDEPENDENT_AMBULATORY_CARE_PROVIDER_SITE_OTHER): Payer: No Typology Code available for payment source | Admitting: Family Medicine

## 2012-12-18 VITALS — BP 101/71 | HR 78 | Temp 99.1°F | Ht 59.0 in | Wt 131.0 lb

## 2012-12-18 DIAGNOSIS — K469 Unspecified abdominal hernia without obstruction or gangrene: Secondary | ICD-10-CM | POA: Insufficient documentation

## 2012-12-18 NOTE — Progress Notes (Signed)
Family Medicine Office Visit Note   Subjective:   Patient ID: Sara Haas, female  DOB: [birth year], 57 y.o.. MRN: [id number]   Pt that comes today requesting referral to General Surgery for elective abdominal hernia repair. She reports in 2000 had surgery secondary to strangulated hernia. Latterly she reports intermittent burning pain in the area and sometimes swelling. Denies redness, nausea, vomiting or intense pain. She has been evaluated by urology and will be scheduled for surgical repair of cystocele, she was wishing to coordinate both surgeries the same day.   Review of Systems:  Pt denies SOB, chest pain, palpitations, headaches, dizziness, numbness or weakness. No changes on urinary or BM habits. No unintentional weigh loss/gain.  Objective:   Physical Exam: Gen:  NAD HEENT: Moist mucous membranes  CV: Regular rate and rhythm, no murmurs rubs or gallops PULM: Clear to auscultation bilaterally. No wheezes/rales/rhonchi ABD: Soft, non tender, non distended, normal bowel sounds. Old healed scar in lower abdomen. Reducible hernia above scar. No erythema.  EXT: No edema Neuro: Alert and oriented x3. No focalization  Assessment & Plan:

## 2012-12-18 NOTE — Patient Instructions (Signed)
Hernia  (Hernia)  Una hernia ocurre cuando un órgano interno protruye a través de un punto débil de los músculos de la pared muscular abdominal (del vientre). Se producen con mayor frecuencia en la ingle y alrededor del ombligo. Generalmente puede volver a colocarse en su lugar (reducirse). La mayor parte de las hernias tienden a empeorar con el tiempo. Algunas hernias abdominales pueden atascarse en la abertura (hernias irreductibles o hernia encarcelada) y no pueden reducirse. Una hernia abdominal irreducible que está ligeramente apretada en la abertura, corre el riesgo de dañar el flujo de sangre (hernia estrangulada). Una hernia estrangulada es una emergencia médica. Debido al riesgo que se corre en caso de hernia irreducible o estrangulada, se recomienda la cirugía para repararla.  CAUSAS  · Levantar peso excesivo.  · Mucha tos.  · Tensión al ir de cuerpo.  · Durante la cirugía abdominal se realiza un corte (incisión).  INSTRUCCIONES PARA EL CUIDADO DOMICILIARIO  · No es necesario hacer reposo en cama. Puede continuar con sus actividades habituales.  · Evite levantar peso (más de 10 libras o 4,5 Kg) o hacer esfuerzos.  · Tos con suavidad. Si actualmente usted fuma, es el momento de abandonar el hábito. Hasta el procedimiento quirúrgico más perfecto puede malograrse si se hace fuerza contínua para toser. Aunque su hernia no haya sido reparada, la tos puede agravar el problema.  · No use nada apretado sobre la hernia. No trate de mantenerla adentro con un vendaje externo o un braguero. Puede lesionar el contenido abdominal si los aprieta dentro del saco de la hernia.  · Consumir una dieta normal.  · Evite la constipación. Si hace mucha fuerza aumentará el tamaño de la hernia y podrá dañarse la reparación. Si no lo logra sólo con la dieta, puede usar laxantes.  SOLICITE ATENCIÓN MÉDICA DE INMEDIATO SI:  · Tiene fiebre.  · Presenta un dolor abdominal cada vez más intenso.  · Si tiene malestar estomacal (náuseas) y  vómitos.  · La hernia se ha atascado fuera del abdomen, se ve descolorida, se siente dura o le duele.  · Observa cambios en el hábito intestinal o en la hernia, lo que no es habitual en usted.  · El dolor o la hinchazón alrededor de la hernia aumentan.  · No puede volver a colocar la hernia en su lugar ejerciendo una presión suave mientras se encuentra recostado.  ESTÉ SEGURO QUE:   · Comprende las instrucciones para el alta médica.  · Controlará su enfermedad.  · Solicitará atención médica de inmediato según las indicaciones.  Document Released: 04/08/2005 Document Revised: 07/01/2011  ExitCare® Patient Information ©2014 [employer].

## 2012-12-18 NOTE — Assessment & Plan Note (Signed)
Reducible. Normal bowel sounds. No acute abdomen. P/ In the process of making the referral we have found that Surgery is not taking any pt with [facility] and she will have to come with $236.00 for the visit and will have to pay out of pocket for her surgery with a payment plan. After discussing with pt she declines referral.

## 2013-02-26 ENCOUNTER — Other Ambulatory Visit: Payer: Self-pay | Admitting: Urology

## 2013-04-01 ENCOUNTER — Other Ambulatory Visit: Payer: Self-pay | Admitting: Urology

## 2013-04-01 MED ORDER — PHENAZOPYRIDINE HCL 100 MG PO TABS: 200.0000 mg | ORAL_TABLET | Freq: Once | ORAL | Status: DC

## 2013-05-11 ENCOUNTER — Encounter (HOSPITAL_COMMUNITY): Payer: Self-pay | Admitting: Pharmacy Technician

## 2013-05-12 ENCOUNTER — Other Ambulatory Visit (HOSPITAL_COMMUNITY): Payer: Self-pay | Admitting: Urology

## 2013-05-12 NOTE — Patient Instructions (Signed)
[place]  05/12/2013   Your procedure is scheduled on:  05/18/13 TUESDAY  Report to [facility] at [place]      AM.  Call this number if you have problems the morning of surgery: [phone]       Remember:   Do not eat food  Or drink :After Midnight. Monday NIGHT   Take these medicines the morning of surgery with A SIP OF WATER: LEVOTHYROXINE   .  Contacts, dentures or partial plates can not be worn to surgery  Leave suitcase in the car. After surgery it may be brought to your room.  For patients admitted to the hospital, checkout time is 11:00 AM day of  discharge.             SPECIAL INSTRUCTIONS- SEE [place] PREPARING FOR SURGERY INSTRUCTION SHEET-     DO NOT WEAR JEWELRY, LOTIONS, POWDERS, OR PERFUMES.  WOMEN-- DO NOT SHAVE LEGS OR UNDERARMS FOR 12 HOURS BEFORE SHOWERS. MEN MAY SHAVE FACE.  Patients discharged the day of surgery will not be allowed to drive home. IF going home the day of surgery, you must have a driver and someone to stay with you for the first 24 hours  Name and phone number of your driver:     Overnight stay-- DAUGHTER                                                                   Please read over the following fact sheets that you were given: MRSA Information, Incentive Spirometry Sheet, Blood Transfusion Sheet  Information                                                                                   SHARON  PST 336  [phone]                 [place]                                                  Patient Signature _____________________________

## 2013-05-13 ENCOUNTER — Encounter (HOSPITAL_COMMUNITY): Payer: Self-pay

## 2013-05-13 ENCOUNTER — Ambulatory Visit (HOSPITAL_COMMUNITY)
Admission: RE | Admit: 2013-05-13 | Discharge: 2013-05-13 | Disposition: A | Discharge status: Home / Self Care | Payer: No Typology Code available for payment source | Source: Ambulatory Visit | Attending: Urology | Admitting: Urology

## 2013-05-13 ENCOUNTER — Encounter (HOSPITAL_COMMUNITY)
Admission: RE | Admit: 2013-05-13 | Discharge: 2013-05-13 | Disposition: A | Discharge status: Home / Self Care | Payer: No Typology Code available for payment source | Source: Ambulatory Visit | Attending: Urology | Admitting: Urology

## 2013-05-13 DIAGNOSIS — J45909 Unspecified asthma, uncomplicated: Secondary | ICD-10-CM | POA: Insufficient documentation

## 2013-05-13 DIAGNOSIS — Z01818 Encounter for other preprocedural examination: Secondary | ICD-10-CM | POA: Insufficient documentation

## 2013-05-13 DIAGNOSIS — R911 Solitary pulmonary nodule: Secondary | ICD-10-CM | POA: Insufficient documentation

## 2013-05-13 DIAGNOSIS — Z01810 Encounter for preprocedural cardiovascular examination: Secondary | ICD-10-CM | POA: Insufficient documentation

## 2013-05-13 DIAGNOSIS — Z01812 Encounter for preprocedural laboratory examination: Secondary | ICD-10-CM | POA: Insufficient documentation

## 2013-05-13 DIAGNOSIS — Z0183 Encounter for blood typing: Secondary | ICD-10-CM | POA: Insufficient documentation

## 2013-05-13 LAB — APTT: aPTT: 24 seconds (ref 24–37)

## 2013-05-13 LAB — PROTIME-INR
INR: 0.95 (ref 0.00–1.49)
PROTHROMBIN TIME: 12.5 s (ref 11.6–15.2)

## 2013-05-13 LAB — CBC
HCT: 40.4 % (ref 36.0–46.0)
Hemoglobin: 12.9 g/dL (ref 12.0–15.0)
MCH: 28.9 pg (ref 26.0–34.0)
MCHC: 31.9 g/dL (ref 30.0–36.0)
MCV: 90.6 fL (ref 78.0–100.0)
PLATELETS: 237 10*3/uL (ref 150–400)
RBC: 4.46 MIL/uL (ref 3.87–5.11)
RDW: 13.4 % (ref 11.5–15.5)
WBC: 6.8 10*3/uL (ref 4.0–10.5)

## 2013-05-13 LAB — ABO/RH: ABO/RH(D): A POS

## 2013-05-13 NOTE — Progress Notes (Signed)
Faxed interpreter confirmation received from clinical social work and placed on pt chart

## 2013-05-13 NOTE — Progress Notes (Signed)
Chest xray results faxed by epic to dr [facility] office

## 2013-05-17 MED ORDER — GENTAMICIN SULFATE 40 MG/ML IJ SOLN
300.0000 mg | INTRAMUSCULAR | Status: AC
  Administered 2013-05-18: 300 mg via INTRAVENOUS
  Filled 2013-05-17: qty 7.5

## 2013-05-17 NOTE — H&P (Signed)
History of Present Illness   [name] has a mild outlet abnormality and primarily an overactive bladder and was here on VESIcare and Toviaz. I did not plan on performing a sling if she ever had prolapse based upon her pelvic examination and tendency for elevated residual and urodynamics. If she had prolapse surgery for her rectocele and probable enterocele and loss of apical support, she would need to have preoperative Estrace cream. She has trouble affording it.   Based upon my history, she has had a sacrocolpopexy and A&P repair in the past and had further surgery I believe for exposed mesh by Dr. Kalman Shan. Her initial surgery I believe was in [place].   She has symptomatic prolapse and did not do well with a pessary.  Urinalysis: I reviewed, few bacteria, urine sent for culture.   Her last 2 residuals was 0.0 mL.   I also did not want to perform a sling because she has had a lot of mesh issues in the past.   She has not followed up with general surgery because she does not have insurance.   Review of Systems: No change in bowel or neurologic systems.    Past Medical History Problems  1. History of Asthma (493.90) 2. History of hypercholesterolemia (V12.29) 3. History of hypothyroidism (V12.29)  Surgical History Problems  1. History of Hysterectomy  Current Meds 1. Albuterol 90 MCG/ACT AERS;  Therapy: (Recorded:15Aug2014) to Recorded 2. Estrace 0.1 MG/GM Vaginal Cream; 1 gm 3x/week x 4 weeks, then 1x per week;  Therapy: 03Sep2014 to (Last Rx:03Sep2014)  Requested for: 03Sep2014 Ordered 3. Levothyroxine Sodium 50 MCG Oral Tablet;  Therapy: (Recorded:15Aug2014) to Recorded 4. Pravastatin Sodium 20 MG Oral Tablet;  Therapy: (Recorded:15Aug2014) to Recorded 5. Toviaz 8 MG Oral Tablet Extended Release 24 Hour; TAKE 1 TABLET DAILY;  Therapy: 03Sep2014 to (Evaluate:29Aug2015); Last Rx:03Sep2014 Ordered 6. VESIcare 5 MG Oral Tablet; a tablet daily for 30 days;  Therapy:  03Sep2014 to (Last Rx:03Sep2014) Ordered  Allergies Medication  1. No Known Drug Allergies  Family History Problems  1. Family history of Family Health Status Number Of Children   2 sons 2 daughters 2. Family history of Hypercholesterolemia  Social History Problems  1. Denied: History of Alcohol Use 2. Caffeine Use   1 per day 3. Marital History - Single  Vitals Vital Signs [Data Includes: Last 1 Day]  Recorded: [id number] 10:25AM  Blood Pressure: 122 / 76 Temperature: 97.8 F Heart Rate: 75  Results/Data  Urine [Data Includes: Last 1 Day]   [id number]  COLOR YELLOW   APPEARANCE CLEAR   SPECIFIC GRAVITY 1.025   pH 5.5   GLUCOSE NEG mg/dL  BILIRUBIN NEG   KETONE NEG mg/dL  BLOOD TRACE   PROTEIN NEG mg/dL  UROBILINOGEN 0.2 mg/dL  NITRITE NEG   LEUKOCYTE ESTERASE NEG   SQUAMOUS EPITHELIAL/HPF MANY   WBC NONE SEEN WBC/hpf  RBC NONE SEEN RBC/hpf  BACTERIA FEW   CRYSTALS NONE SEEN   CASTS NONE SEEN    Assessment Assessed  1. Rectocele, female (618.04) 2. 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  VESIcare 5 MG Oral Tablet a tablet daily for 30 days  Plan Rectocele, female  1. Follow-up Schedule Surgery Office  Follow-up  Status: Complete  Done: [id number] Rectocele, female, Urge and stress incontinence  2. URINE CULTURE; Status:In Progress - Specimen/Data Collected;   Done: [id number]  Discussion/Summary   I drew [name] a picture. Her daughter did a great job in translating. I went over a transvaginal rectocele repair, enterocele repair, and likely an iliococcygeus vault suspension or sacrospinous vault suspension. I will do the best I can to try to stay  away from any graft. We talked about a pessary and that it may be more comfortable now with the Estrace that she has used for 2 months. She really thinks the VESIcare helped her urgency and wants to stay on it.   Watchful waiting was discussed.   I drew her a picture and we talked about prolapse surgery in detail. Pros, cons, general surgical and anesthetic risks, and other options including behavioral therapy, pessaries, and watchful waiting were discussed. She understands that prolapse repairs are successful in 80-85% of cases for prolapse symptoms and can recur anteriorly, posteriorly, and/or apically. She understands that in most cases I use a graft and general risks were discussed. Surgical risks were described but not limited to the discussion of injury to neighboring structures including the bowel (with possible life-threatening sepsis and colostomy), bladder, urethra, vagina (all resulting in further surgery), and ureter (resulting in re-implantation). We talked about injury to nerves/soft tissue leading to debilitating and intractable pelvic, abdominal, and lower extremity pain syndromes and neuropathies. The risks of buttock pain, intractable dyspareunia, and vaginal narrowing and shortening with sequelae were discussed. Bleeding risks, transfusion rates, and infection were discussed. The risk of persistent, de novo, or worsening bladder and/or bowel incontinence/dysfunction was discussed. The need for CIC was described as well the usual post-operative course. The patient understands that she might not reach her treatment goal and that she might be worse following surgery.  She understands the risk of injury with serious sequelae being even greater because of the previous surgical history. Reexposure rates were also discussed. Worsening incontinence was also discussed.   She would like to proceed with surgery. I gave her more VESIcare samples and a prescription. I gave her Estrace samples and she  will stay on it twice a week before her surgery and have her come and get samples as needed.  After a thorough review of the management options for the patient's condition the patient  elected to proceed with surgical therapy as noted above. We have discussed the potential benefits and risks of the procedure, side effects of the proposed treatment, the likelihood of the patient achieving the goals of the procedure, and any potential problems that might occur during the procedure or recuperation. Informed consent has been obtained.

## 2013-05-18 ENCOUNTER — Encounter (HOSPITAL_COMMUNITY): Payer: Self-pay | Admitting: *Deleted

## 2013-05-18 ENCOUNTER — Encounter (HOSPITAL_COMMUNITY)
Admission: RE | Disposition: A | Discharge status: Home / Self Care | Payer: Self-pay | Source: Ambulatory Visit | Attending: Urology

## 2013-05-18 ENCOUNTER — Encounter (HOSPITAL_COMMUNITY): Payer: No Typology Code available for payment source | Admitting: Anesthesiology

## 2013-05-18 ENCOUNTER — Ambulatory Visit (HOSPITAL_COMMUNITY): Payer: No Typology Code available for payment source | Admitting: Anesthesiology

## 2013-05-18 ENCOUNTER — Observation Stay (HOSPITAL_COMMUNITY)
Admission: RE | Admit: 2013-05-18 | Discharge: 2013-05-19 | Disposition: A | Discharge status: Home / Self Care | Payer: No Typology Code available for payment source | Source: Ambulatory Visit | Attending: Urology | Admitting: Urology

## 2013-05-18 DIAGNOSIS — N318 Other neuromuscular dysfunction of bladder: Secondary | ICD-10-CM | POA: Insufficient documentation

## 2013-05-18 DIAGNOSIS — N3946 Mixed incontinence: Secondary | ICD-10-CM | POA: Insufficient documentation

## 2013-05-18 DIAGNOSIS — E039 Hypothyroidism, unspecified: Secondary | ICD-10-CM | POA: Insufficient documentation

## 2013-05-18 DIAGNOSIS — N816 Rectocele: Principal | ICD-10-CM | POA: Insufficient documentation

## 2013-05-18 DIAGNOSIS — Z79899 Other long term (current) drug therapy: Secondary | ICD-10-CM | POA: Insufficient documentation

## 2013-05-18 DIAGNOSIS — E7800 Pure hypercholesterolemia, unspecified: Secondary | ICD-10-CM | POA: Insufficient documentation

## 2013-05-18 DIAGNOSIS — J45909 Unspecified asthma, uncomplicated: Secondary | ICD-10-CM | POA: Insufficient documentation

## 2013-05-18 DIAGNOSIS — Z90710 Acquired absence of both cervix and uterus: Secondary | ICD-10-CM | POA: Insufficient documentation

## 2013-05-18 HISTORY — PX: CYSTOSCOPY: SHX5120

## 2013-05-18 HISTORY — PX: ANTERIOR AND POSTERIOR REPAIR: SHX5121

## 2013-05-18 HISTORY — PX: VAGINAL PROLAPSE REPAIR: SHX830

## 2013-05-18 LAB — HEMOGLOBIN AND HEMATOCRIT, BLOOD
HCT: 34.9 % — ABNORMAL LOW (ref 36.0–46.0)
Hemoglobin: 11.6 g/dL — ABNORMAL LOW (ref 12.0–15.0)

## 2013-05-18 LAB — TYPE AND SCREEN
ABO/RH(D): A POS
Antibody Screen: NEGATIVE

## 2013-05-18 SURGERY — ANTERIOR (CYSTOCELE) AND POSTERIOR REPAIR (RECTOCELE)
Anesthesia: General

## 2013-05-18 MED ORDER — SODIUM CHLORIDE 0.9 % IR SOLN
Status: DC | PRN
  Administered 2013-05-18: 08:00:00

## 2013-05-18 MED ORDER — LIDOCAINE HCL (CARDIAC) 20 MG/ML IV SOLN
INTRAVENOUS | Status: AC
  Filled 2013-05-18: qty 5

## 2013-05-18 MED ORDER — PROMETHAZINE HCL 25 MG/ML IJ SOLN
6.2500 mg | INTRAMUSCULAR | Status: DC | PRN
  Administered 2013-05-18: 12.5 mg via INTRAVENOUS

## 2013-05-18 MED ORDER — SIMVASTATIN 10 MG PO TABS
10.0000 mg | ORAL_TABLET | Freq: Every day | ORAL | Status: DC
  Administered 2013-05-18: 10 mg via ORAL
  Filled 2013-05-18 (×2): qty 1

## 2013-05-18 MED ORDER — STERILE WATER FOR IRRIGATION IR SOLN
Status: DC | PRN
  Administered 2013-05-18: 3000 mL via INTRAVESICAL

## 2013-05-18 MED ORDER — PROMETHAZINE HCL 25 MG/ML IJ SOLN
INTRAMUSCULAR | Status: AC
  Filled 2013-05-18: qty 1

## 2013-05-18 MED ORDER — DEXAMETHASONE SODIUM PHOSPHATE 10 MG/ML IJ SOLN
INTRAMUSCULAR | Status: AC
  Filled 2013-05-18: qty 1

## 2013-05-18 MED ORDER — HYDROMORPHONE HCL PF 1 MG/ML IJ SOLN
INTRAMUSCULAR | Status: AC
  Filled 2013-05-18: qty 1

## 2013-05-18 MED ORDER — GLYCOPYRROLATE 0.2 MG/ML IJ SOLN
INTRAMUSCULAR | Status: AC
  Filled 2013-05-18: qty 2

## 2013-05-18 MED ORDER — METHYLENE BLUE 1 % INJ SOLN
INTRAMUSCULAR | Status: DC | PRN
  Administered 2013-05-18: 10 mL via INTRAVENOUS

## 2013-05-18 MED ORDER — DEXAMETHASONE SODIUM PHOSPHATE 10 MG/ML IJ SOLN
INTRAMUSCULAR | Status: DC | PRN
  Administered 2013-05-18: 10 mg via INTRAVENOUS

## 2013-05-18 MED ORDER — PROMETHAZINE HCL 25 MG/ML IJ SOLN: 6.2500 mg | INTRAMUSCULAR | Status: DC | PRN

## 2013-05-18 MED ORDER — ONDANSETRON HCL 4 MG/2ML IJ SOLN
INTRAMUSCULAR | Status: AC
  Filled 2013-05-18: qty 2

## 2013-05-18 MED ORDER — LIDOCAINE HCL (CARDIAC) 20 MG/ML IV SOLN
INTRAVENOUS | Status: DC | PRN
  Administered 2013-05-18: 100 mg via INTRAVENOUS

## 2013-05-18 MED ORDER — AMPICILLIN-SULBACTAM SODIUM 1.5 (1-0.5) G IJ SOLR
INTRAMUSCULAR | Status: AC
  Filled 2013-05-18: qty 1.5

## 2013-05-18 MED ORDER — ROCURONIUM BROMIDE 100 MG/10ML IV SOLN
INTRAVENOUS | Status: DC | PRN
  Administered 2013-05-18: 30 mg via INTRAVENOUS

## 2013-05-18 MED ORDER — HYDROCODONE-ACETAMINOPHEN 5-325 MG PO TABS: 1.0000 | ORAL_TABLET | Freq: Four times a day (QID) | ORAL | Status: DC | PRN

## 2013-05-18 MED ORDER — MIDAZOLAM HCL 2 MG/2ML IJ SOLN
INTRAMUSCULAR | Status: AC
  Filled 2013-05-18: qty 2

## 2013-05-18 MED ORDER — ESTRADIOL 0.1 MG/GM VA CREA
TOPICAL_CREAM | VAGINAL | Status: DC | PRN
  Administered 2013-05-18: 1 via VAGINAL

## 2013-05-18 MED ORDER — FENTANYL CITRATE 0.05 MG/ML IJ SOLN
INTRAMUSCULAR | Status: DC | PRN
  Administered 2013-05-18 (×2): 50 ug via INTRAVENOUS

## 2013-05-18 MED ORDER — SUCCINYLCHOLINE CHLORIDE 20 MG/ML IJ SOLN
INTRAMUSCULAR | Status: DC | PRN
  Administered 2013-05-18: 100 mg via INTRAVENOUS

## 2013-05-18 MED ORDER — LIDOCAINE-EPINEPHRINE (PF) 1 %-1:200000 IJ SOLN
INTRAMUSCULAR | Status: DC | PRN
  Administered 2013-05-18: 20 mL

## 2013-05-18 MED ORDER — DEXTROSE-NACL 5-0.45 % IV SOLN
INTRAVENOUS | Status: DC
  Administered 2013-05-18 – 2013-05-19 (×3): via INTRAVENOUS

## 2013-05-18 MED ORDER — FENTANYL CITRATE 0.05 MG/ML IJ SOLN
INTRAMUSCULAR | Status: AC
  Filled 2013-05-18: qty 5

## 2013-05-18 MED ORDER — GLYCOPYRROLATE 0.2 MG/ML IJ SOLN
INTRAMUSCULAR | Status: DC | PRN
  Administered 2013-05-18: 0.4 mg via INTRAVENOUS

## 2013-05-18 MED ORDER — METHYLENE BLUE 1 % INJ SOLN
INTRAMUSCULAR | Status: AC
  Filled 2013-05-18: qty 10

## 2013-05-18 MED ORDER — PROPOFOL 10 MG/ML IV BOLUS
INTRAVENOUS | Status: AC
  Filled 2013-05-18: qty 20

## 2013-05-18 MED ORDER — HYDROMORPHONE HCL PF 1 MG/ML IJ SOLN: 0.2500 mg | INTRAMUSCULAR | Status: DC | PRN

## 2013-05-18 MED ORDER — MORPHINE SULFATE 2 MG/ML IJ SOLN
2.0000 mg | INTRAMUSCULAR | Status: DC | PRN
  Administered 2013-05-18: 2 mg via INTRAVENOUS
  Administered 2013-05-19: 4 mg via INTRAVENOUS
  Filled 2013-05-18: qty 1
  Filled 2013-05-18: qty 2

## 2013-05-18 MED ORDER — NEOSTIGMINE METHYLSULFATE 1 MG/ML IJ SOLN
INTRAMUSCULAR | Status: AC
  Filled 2013-05-18: qty 10

## 2013-05-18 MED ORDER — HYDROCODONE-ACETAMINOPHEN 5-325 MG PO TABS
1.0000 | ORAL_TABLET | ORAL | Status: DC | PRN
  Administered 2013-05-19: 1 via ORAL
  Filled 2013-05-18: qty 1

## 2013-05-18 MED ORDER — LACTATED RINGERS IV SOLN
INTRAVENOUS | Status: DC | PRN
  Administered 2013-05-18 (×2): via INTRAVENOUS

## 2013-05-18 MED ORDER — NEOSTIGMINE METHYLSULFATE 1 MG/ML IJ SOLN
INTRAMUSCULAR | Status: DC | PRN
  Administered 2013-05-18: 3 mg via INTRAVENOUS

## 2013-05-18 MED ORDER — PNEUMOCOCCAL VAC POLYVALENT 25 MCG/0.5ML IJ INJ
0.5000 mL | INJECTION | INTRAMUSCULAR | Status: AC
  Administered 2013-05-19: 0.5 mL via INTRAMUSCULAR
  Filled 2013-05-18 (×2): qty 0.5

## 2013-05-18 MED ORDER — ACETAMINOPHEN 325 MG PO TABS: 650.0000 mg | ORAL_TABLET | ORAL | Status: DC | PRN

## 2013-05-18 MED ORDER — PROPOFOL 10 MG/ML IV BOLUS
INTRAVENOUS | Status: DC | PRN
  Administered 2013-05-18: 150 mg via INTRAVENOUS

## 2013-05-18 MED ORDER — MIDAZOLAM HCL 5 MG/5ML IJ SOLN
INTRAMUSCULAR | Status: DC | PRN
  Administered 2013-05-18: 2 mg via INTRAVENOUS

## 2013-05-18 MED ORDER — ONDANSETRON HCL 4 MG/2ML IJ SOLN
INTRAMUSCULAR | Status: DC | PRN
  Administered 2013-05-18: 4 mg via INTRAVENOUS

## 2013-05-18 MED ORDER — HYDROMORPHONE HCL PF 1 MG/ML IJ SOLN
0.2500 mg | INTRAMUSCULAR | Status: DC | PRN
  Administered 2013-05-18 (×2): 0.5 mg via INTRAVENOUS

## 2013-05-18 MED ORDER — LEVOTHYROXINE SODIUM 50 MCG PO TABS
50.0000 ug | ORAL_TABLET | Freq: Every day | ORAL | Status: DC
  Administered 2013-05-19: 50 ug via ORAL
  Filled 2013-05-18 (×2): qty 1

## 2013-05-18 MED ORDER — ESTRADIOL 0.1 MG/GM VA CREA
TOPICAL_CREAM | VAGINAL | Status: AC
  Filled 2013-05-18: qty 85

## 2013-05-18 MED ORDER — ROCURONIUM BROMIDE 100 MG/10ML IV SOLN
INTRAVENOUS | Status: AC
  Filled 2013-05-18: qty 1

## 2013-05-18 MED ORDER — SODIUM CHLORIDE 0.9 % IV SOLN
1.5000 g | INTRAVENOUS | Status: AC
  Administered 2013-05-18: 1.5 g via INTRAVENOUS

## 2013-05-18 SURGICAL SUPPLY — 67 items
ADH SKN CLS APL DERMABOND .7 (GAUZE/BANDAGES/DRESSINGS)
BAG URINE DRAINAGE (UROLOGICAL SUPPLIES) ×3 IMPLANT
BLADE HEX COATED 2.75 (ELECTRODE) ×3 IMPLANT
BLADE SURG 15 STRL LF DISP TIS (BLADE) ×2 IMPLANT
BLADE SURG 15 STRL SS (BLADE) ×3
CATH FOLEY 2WAY SLVR  5CC 14FR (CATHETERS)
CATH FOLEY 2WAY SLVR  5CC 16FR (CATHETERS) ×2
CATH FOLEY 2WAY SLVR 5CC 14FR (CATHETERS) ×1 IMPLANT
CATH FOLEY 2WAY SLVR 5CC 16FR (CATHETERS) ×1 IMPLANT
COVER MAYO STAND STRL (DRAPES) ×2 IMPLANT
COVER SURGICAL LIGHT HANDLE (MISCELLANEOUS) ×5 IMPLANT
DECANTER SPIKE VIAL GLASS SM (MISCELLANEOUS) ×1 IMPLANT
DERMABOND ADVANCED (GAUZE/BANDAGES/DRESSINGS)
DERMABOND ADVANCED .7 DNX12 (GAUZE/BANDAGES/DRESSINGS) IMPLANT
DEVICE CAPIO SLIM SINGLE (INSTRUMENTS) ×2 IMPLANT
DEVICE CAPIO SUTURING (INSTRUMENTS)
DEVICE CAPIO SUTURING OPC (INSTRUMENTS) ×1 IMPLANT
DRAIN PENROSE 18X1/4 LTX STRL (WOUND CARE) ×3 IMPLANT
DRAPE LG THREE QUARTER DISP (DRAPES) ×3 IMPLANT
ELECT REM PT RETURN 9FT ADLT (ELECTROSURGICAL) ×3
ELECTRODE REM PT RTRN 9FT ADLT (ELECTROSURGICAL) ×1 IMPLANT
GAUZE PACKING 2X5 YD STRL (GAUZE/BANDAGES/DRESSINGS) ×3 IMPLANT
GAUZE SPONGE 4X4 16PLY XRAY LF (GAUZE/BANDAGES/DRESSINGS) ×8 IMPLANT
GLOVE BIO SURGEON STRL SZ 6.5 (GLOVE) ×3 IMPLANT
GLOVE BIO SURGEON STRL SZ7.5 (GLOVE) ×5 IMPLANT
GLOVE BIO SURGEONS STRL SZ 6.5 (GLOVE) ×2
GLOVE BIOGEL M STRL SZ7.5 (GLOVE) ×3 IMPLANT
GLOVE ECLIPSE 8.5 STRL (GLOVE) ×8 IMPLANT
GOWN STRL REUS W/TWL XL LVL3 (GOWN DISPOSABLE) ×6 IMPLANT
HOLDER FOLEY CATH W/STRAP (MISCELLANEOUS) ×1 IMPLANT
IV NS 1000ML (IV SOLUTION)
IV NS 1000ML BAXH (IV SOLUTION) ×1 IMPLANT
KIT BASIN OR (CUSTOM PROCEDURE TRAY) ×3 IMPLANT
NDL MAYO 6 CRC TAPER PT (NEEDLE) ×1 IMPLANT
NEEDLE HYPO 22GX1.5 SAFETY (NEEDLE) ×2 IMPLANT
NEEDLE MAYO .5 CIRCLE (NEEDLE) ×2 IMPLANT
NEEDLE MAYO 6 CRC TAPER PT (NEEDLE) IMPLANT
NS IRRIG 1000ML POUR BTL (IV SOLUTION) ×1 IMPLANT
PACK CYSTO (CUSTOM PROCEDURE TRAY) ×3 IMPLANT
PENCIL BUTTON HOLSTER BLD 10FT (ELECTRODE) ×3 IMPLANT
PLUG CATH AND CAP STER (CATHETERS) ×3 IMPLANT
POSITIONER SURGICAL ARM (MISCELLANEOUS) ×3 IMPLANT
RETRACTOR STAY HOOK 5MM (MISCELLANEOUS) ×3 IMPLANT
SHEET LAVH (DRAPES) ×3 IMPLANT
SUT CAPIO ETHIBPND (SUTURE) ×6 IMPLANT
SUT SILK 2 0 SH (SUTURE) ×3 IMPLANT
SUT VIC AB 0 CT1 27 (SUTURE) ×3
SUT VIC AB 0 CT1 27XBRD ANTBC (SUTURE) ×2 IMPLANT
SUT VIC AB 2-0 CT1 27 (SUTURE) ×3
SUT VIC AB 2-0 CT1 27XBRD (SUTURE) ×2 IMPLANT
SUT VIC AB 2-0 SH 27 (SUTURE) ×21
SUT VIC AB 2-0 SH 27X BRD (SUTURE) ×6 IMPLANT
SUT VIC AB 3-0 PS2 18 (SUTURE)
SUT VIC AB 3-0 PS2 18XBRD (SUTURE) IMPLANT
SUT VIC AB 3-0 SH 27 (SUTURE) ×3
SUT VIC AB 3-0 SH 27XBRD (SUTURE) ×6 IMPLANT
SUT VIC AB 4-0 PS2 27 (SUTURE) ×1 IMPLANT
SUT VICRYL 0 UR6 27IN ABS (SUTURE) ×17 IMPLANT
SYRINGE 10CC LL (SYRINGE) ×3 IMPLANT
TISSUE REPAIR XENFORM 4X7CM (Tissue) ×2 IMPLANT
TOWEL OR 17X26 10 PK STRL BLUE (TOWEL DISPOSABLE) ×3 IMPLANT
TOWEL OR NON WOVEN STRL DISP B (DISPOSABLE) ×3 IMPLANT
TUBING CONNECTING 10 (TUBING) ×4 IMPLANT
TUBING CONNECTING 10' (TUBING) ×2
WATER STERILE IRR 1500ML POUR (IV SOLUTION) ×1 IMPLANT
WATER STERILE IRR 500ML POUR (IV SOLUTION) ×3 IMPLANT
YANKAUER SUCT BULB TIP 10FT TU (MISCELLANEOUS) ×3 IMPLANT

## 2013-05-18 NOTE — Anesthesia Preprocedure Evaluation (Addendum)
Anesthesia Evaluation  Patient identified by MRN, date of birth, ID band Patient awake    Reviewed: Allergy & Precautions, H&P , NPO status , Patient's Chart, lab work & pertinent test results  Airway Mallampati: II TM Distance: >3 FB Neck ROM: Full    Dental no notable dental hx.    Pulmonary asthma ,  breath sounds clear to auscultation  Pulmonary exam normal       Cardiovascular negative cardio ROS  Rhythm:Regular Rate:Normal     Neuro/Psych negative neurological ROS  negative psych ROS   GI/Hepatic negative GI ROS, Neg liver ROS,   Endo/Other  Hypothyroidism   Renal/GU negative Renal ROS  negative genitourinary   Musculoskeletal negative musculoskeletal ROS (+)   Abdominal   Peds negative pediatric ROS (+)  Hematology negative hematology ROS (+)   Anesthesia Other Findings   Reproductive/Obstetrics negative OB ROS                           Anesthesia Physical Anesthesia Plan  ASA: II  Anesthesia Plan: General   Post-op Pain Management:    Induction: Intravenous  Airway Management Planned: Oral ETT  Additional Equipment:   Intra-op Plan:   Post-operative Plan: Extubation in OR  Informed Consent: I have reviewed the patients History and Physical, chart, labs and discussed the procedure including the risks, benefits and alternatives for the proposed anesthesia with the patient or authorized representative who has indicated his/her understanding and acceptance.   Dental advisory given  Plan Discussed with: CRNA  Anesthesia Plan Comments: (Discussed general anesthesia with patient through professional interpreter Estill Bamberg). Questions answered. Patient had already been informed of her abnormal CXR and recommendation for follow-up)       Anesthesia Quick Evaluation

## 2013-05-18 NOTE — Discharge Instructions (Signed)
As discussed with Dr. Macdiarmid. °

## 2013-05-18 NOTE — Transfer of Care (Signed)
Immediate Anesthesia Transfer of Care Note  Patient: Sekai Nayak  Procedure(s) Performed: Procedure(s):  REPAIR OF RECTOCELE WITH GRAFT,REPAIR OF ENTEROCELE  (N/A)  VAULT PROLAPSE  (N/A) CYSTOSCOPY (N/A)  Patient Location: PACU  Anesthesia Type:General  Level of Consciousness: sedated  Airway & Oxygen Therapy: Patient Spontanous Breathing and Patient connected to face mask oxygen  Post-op Assessment: Report given to PACU RN and Post -op Vital signs reviewed and stable  Post vital signs: Reviewed and stable  Complications: No apparent anesthesia complications

## 2013-05-18 NOTE — Anesthesia Postprocedure Evaluation (Signed)
  Anesthesia Post-op Note  Patient: Sara Haas  Procedure(s) Performed: Procedure(s) (LRB):  REPAIR OF RECTOCELE WITH GRAFT,REPAIR OF ENTEROCELE  (N/A)  VAULT PROLAPSE  (N/A) CYSTOSCOPY (N/A)  Patient Location: PACU  Anesthesia Type: General  Level of Consciousness: awake and alert   Airway and Oxygen Therapy: Patient Spontanous Breathing  Post-op Pain: mild  Post-op Assessment: Post-op Vital signs reviewed, Patient's Cardiovascular Status Stable, Respiratory Function Stable, Patent Airway and No signs of Nausea or vomiting  Last Vitals:  Filed Vitals:   05/18/13 1154  BP:   Pulse:   Temp: 36.2 C  Resp:     Post-op Vital Signs: stable   Complications: No apparent anesthesia complications

## 2013-05-18 NOTE — Interval H&P Note (Signed)
History and Physical Interval Note:  05/18/2013 7:19 AM  Sara Haas  has presented today for surgery, with the diagnosis of RECTOCELE VAULT PROLAPSE ENTEROCELE  The various methods of treatment have been discussed with the patient and family. After consideration of risks, benefits and other options for treatment, the patient has consented to  Procedure(s):  REPAIR OF RECTOCELE ENTEROCELE  (N/A)  VAULT PROLAPSE AND GRAFT  (N/A) CYSTOSCOPY (N/A) as a surgical intervention .  The patient's history has been reviewed, patient examined, no change in status, stable for surgery.  I have reviewed the patient's chart and labs.  Questions were answered to the patient's satisfaction.     Sukhmani Fetherolf A

## 2013-05-18 NOTE — Progress Notes (Signed)
Patient admitted from PACU, arousable and follow command, Spanish speaking, interpreter at the bed side. Denies any pain. Foley inplace draining greenish/yellowish urine. Reviewed plan of care with patient/family, will continue to assess patient.

## 2013-05-18 NOTE — Progress Notes (Signed)
Patient up with one person assist,  ambulated in the hall about 140ft, C/O some discomfort, PRN pain medication given prior to ambulation. Foley output more greenish than yellow, no clot  Noted. Will continue to monitor patient.

## 2013-05-18 NOTE — Progress Notes (Signed)
Called Dr. Franne Grip to confirm about the order for cardiac monitor he stated patient does not need to be on cardiac monitor.

## 2013-05-18 NOTE — Op Note (Signed)
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 Nicki Reaper Tyqwan Pink Assistant: Leta Baptist  The patient has the above diagnoses and consented above procedure. Preoperative antibiotics were given.Extraa care was taken with leg positioning to minimize the risk of compartment syndrome and neuropathy and deep vein thrombosis.  On initial inspection the patient had vaginal wall shortening. She appeared to have an enterocele as well as a rectocele and a very short anterior vaginal wall with a high small grade 2 cystocele with no central defect. She had mild atrophy.  I marked the vaginal cuff with a 3-0 Vicryl suture. I instilled 20 cc of a lidocaine epinephrine mixture to develop a plane  Between 2 Allis clamps on the atrophic hymenal ring I removed a small triangle of perineal skin. I was very careful making a midline incision posteriorly all the way to the apex. Tissues were very thin. Minimal sharp dissection and mild blunt dissection was done because tissue planes were thin and it was easy to develop between the probable enterocele and posterior vaginal wall  I did 5 or 6 rectal examinations throughout the case. The first helped identify what I thought was the enterocele from the rectocele. I could feel the bladder anteriorly away from the area of dissection.  With a little bit of sharp and blunt dissection and between 2 small hemostats I opened the enterocele sac. With the patient in Trendelenburg her small bowel was easily visualized high in the pelvis and not coming down into the sac. During the case some nonabsorbable sutures were found twice and easily removed. I opened the sac widely.  Using 2-0 Vicryl I tied off the neck of the sac far away from bowel and ureters. I had mobilized nicely and  therel was no soft tissue around the thin wall sac. After closing the neck of the sac I removed part of it and sent it for pathology  Repeat rectal examination identified rectal wall and a modest rectocele. She had a significant apical defect in the rectovaginal fascia. I closed the apical defect while doing a rectal examination simultaneously with running 2-0 Vicryl on an SH needle.  I then did a 2 layer posterior repair with 2-0 Vicryl imbricating the thin rectovaginal fascia.  The fascia was very thin. I repeated the rectal examination and was very pleased with the length of the rectum and support. There was no suture in the rectum  With appropriate dissection I mobilized did feel the ischial spine bilaterally. I D.O. device was used to place 0 Ethibond into the iliococcygeus muscle a few centimeters caudal to the initial spine. I was happy with the strength of the sutures and again they were not in the rectum.  I shaped a 7x4 cm dermal graft well-prepared as a trapezoid and sewed it in place and distally I approximated to the rectovaginal fascia with 0 Vicryl suture. The graft was tension-free in good position.  I trimmed an appropriate amount of posterior vaginal wall and closed it with running 2-0 Vicryl on a CT1 needle. 0 Vicryl was used to gently close the perineum. I exteriorize to 2-0 Vicryl and closed the perineum with subcuticular suture.  I was very pleased with the repair. Blood loss was less than 50 mL. She had some shortening of the vaginal length due to her presenting anatomy but overall the length was very good and  she excellent had support.  I cystoscoped the patient. There was no distortion of the ureters. There was eflux bilaterally. Unfortunately she did not get her peridium prior to surgery but I did give indigo carmine intraoperatively.  Vaginal pack with Estrace inserted. Lead position excellent. Patient was taken to recovery room

## 2013-05-18 NOTE — Progress Notes (Signed)
Limited hx with language barrier Looks great No pain Labs pending

## 2013-05-18 NOTE — Care Management Note (Signed)
    Page 1 of 1   05/18/2013     3:39:39 PM   CARE MANAGEMENT NOTE 05/18/2013  Patient:  Sara Haas, Sara Haas   Account Number:  [id number]  Date Initiated:  05/18/2013  Documentation initiated by:  Dessa Phi  Subjective/Objective Assessment:   [address] [place].     Action/Plan:   FROM HOME.HAS PCP,PHARMACY.   Anticipated DC Date:  05/19/2013   Anticipated DC Plan:  [place]  CM consult      Choice offered to / List presented to:             Status of service:  In process, will continue to follow Medicare Important Message given?   (If response is "NO", the following Medicare IM given date fields will be blank) Date Medicare IM given:   Date Additional Medicare IM given:    Discharge Disposition:    Per UR Regulation:  Reviewed for med. necessity/level of care/duration of stay  If discussed at [place] of Stay Meetings, dates discussed:    Comments:  05/18/13 Marvis Saefong RN,BSN NCM [phone] S/P [employer].NO ANTICIPATED D/C NEEDS.

## 2013-05-19 ENCOUNTER — Encounter (HOSPITAL_COMMUNITY): Payer: Self-pay | Admitting: Urology

## 2013-05-19 LAB — BASIC METABOLIC PANEL
BUN: 10 mg/dL (ref 6–23)
CO2: 25 meq/L (ref 19–32)
Calcium: 8.7 mg/dL (ref 8.4–10.5)
Chloride: 104 mEq/L (ref 96–112)
Creatinine, Ser: 0.6 mg/dL (ref 0.50–1.10)
GFR calc Af Amer: >90 mL/min (ref >90)
Glucose, Bld: 137 mg/dL — ABNORMAL HIGH (ref 70–99)
POTASSIUM: 4 meq/L (ref 3.7–5.3)
SODIUM: 140 meq/L (ref 137–147)

## 2013-05-19 LAB — HEMOGLOBIN AND HEMATOCRIT, BLOOD
HEMATOCRIT: 33 % — AB (ref 36.0–46.0)
Hemoglobin: 10.5 g/dL — ABNORMAL LOW (ref 12.0–15.0)

## 2013-05-19 NOTE — Progress Notes (Signed)
Vaginal packing and foley catheter discontinued.  Interpreter was used to inform patient that we needed to scan her bladder after she voids.  Patient understood.  Will page MD with PVR findings and continue to monitor.

## 2013-05-19 NOTE — Discharge Summary (Signed)
Date of admission: 05/18/2013  Date of discharge: 05/19/2013  Admission diagnosis:retocele and vault prolapse  Discharge diagnosis: Rectocele and vault prolapse  Secondary diagnoses: enterocele  History and Physical: For full details, please see admission history and physical. Briefly, Sara Haas is a 58 y.o. year old patient with the above diagnosis.Marland Kitchen   Hospital Course: Surgery uneventful. Good post op course. Labs normal  Laboratory values:  Recent Labs  05/18/13 1605 05/19/13 0453  HGB 11.6* 10.5*  HCT 34.9* 33.0*    Recent Labs  05/19/13 0453  CREATININE 0.60    Disposition: Home  Discharge instruction: The patient was instructed to be ambulatory but told to refrain from heavy lifting, strenuous activity, or driving. Discussed in detail  Discharge medications:    Medication List         HYDROcodone-acetaminophen 5-325 MG per tablet  Commonly known as:  NORCO  Take 1-2 tablets by mouth every 6 (six) hours as needed.     levothyroxine 50 MCG tablet  Commonly known as:  SYNTHROID, LEVOTHROID  Take 50 mcg by mouth daily before breakfast.     pravastatin 20 MG tablet  Commonly known as:  PRAVACHOL  Take 20 mg by mouth at bedtime.        Followup:      Follow-up Information   Follow up with Sumner Boesch A, MD. (call office for date and time of follow up appt.)    Specialty:  Urology   Contact information:   [facility]  [place] [address] [phone]

## 2013-05-19 NOTE — Progress Notes (Signed)
Vitals normal Laboratory tests normal Patient alert and stable Pain minimal and well-controlled Pyridium colored urine Post treatment course discussed in detail Followup discussed in detail See orders

## 2013-05-19 NOTE — Progress Notes (Signed)
Checked PVR after patient voided small amount and was 35cc.  Pt. BP also low at 74/52 when checked manually.  Patient's daughter stated patient told her "she felt dizzy and not right after eating breakfast."  MD informed of findings and told to monitor patient for a couple more hours and check another PVR. Will continue to monitor and notify MD accordingly.

## 2013-06-16 ENCOUNTER — Ambulatory Visit: Payer: No Typology Code available for payment source

## 2013-07-20 ENCOUNTER — Encounter: Payer: Self-pay | Admitting: Family Medicine

## 2013-07-20 ENCOUNTER — Ambulatory Visit (INDEPENDENT_AMBULATORY_CARE_PROVIDER_SITE_OTHER): Payer: No Typology Code available for payment source | Admitting: Family Medicine

## 2013-07-20 VITALS — BP 119/71 | HR 72 | Temp 98.2°F | Ht <= 58 in | Wt 127.5 lb

## 2013-07-20 DIAGNOSIS — R911 Solitary pulmonary nodule: Secondary | ICD-10-CM

## 2013-07-20 MED ORDER — ALBUTEROL SULFATE HFA 108 (90 BASE) MCG/ACT IN AERS: 2.0000 | INHALATION_SPRAY | Freq: Four times a day (QID) | RESPIRATORY_TRACT | Status: DC | PRN

## 2013-07-20 NOTE — Assessment & Plan Note (Addendum)
CXR 05/13/13 revealed at least 1 nodule 7 mm in size, possibly 2 and previous granulomatous disease consistent with treated TB. CT chest ordered today. Will call with results. Refill for albuterol in case she needs it.  Other inhaler likely expired.

## 2013-07-20 NOTE — Progress Notes (Signed)
Subjective:    Sara Haas is a 58 y.o. female who presents to [facility] today:  1.  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  Had surgical urological repair in Jan.  Pre-op CXR revealed Pulm nodule with recommendations for FU CT of chest.  Denies any cough, sweating at night.  Has lost about 10 lbs in past 6 months unintentional.  History of asthma -- dyspneic only with exposure to cleaning chemicals, relieved with H2O consumption or very occasional (<1 month) inhaler usage.  Has had same inhaler for at least 2 years, maybe longer.   ROS as above per HPI, otherwise neg.  Pertinently, no chest pain, palpitations, SOB, Fever, Chills, Abd pain, N/V/D.   The following portions of the patient's history were reviewed and updated as appropriate: allergies, current medications, past medical history, family and social history, and problem list. Patient is a nonsmoker.    PMH reviewed.  Past Medical History  Diagnosis Date  . Thyroid disease     hypothyroidism  . Hyperlipidemia   . Asthma    Past Surgical History  Procedure Laterality Date  . Cystocele repair  2009  . Abdominal hysterectomy    . Bladder surgery    . Anterior and posterior repair N/A 05/18/2013    Procedure:  REPAIR OF RECTOCELE WITH GRAFT,REPAIR OF ENTEROCELE ;  Surgeon: Reece Packer, MD;  Location: [facility];  Service: Urology;  Laterality: N/A;  . Vaginal prolapse repair N/A 05/18/2013    Procedure:  VAULT PROLAPSE ;  Surgeon: Reece Packer, MD;  Location: [facility];  Service: Urology;  Laterality: N/A;  . Cystoscopy N/A 05/18/2013    Procedure: CYSTOSCOPY;  Surgeon: Reece Packer, MD;  Location: [facility];  Service: Urology;  Laterality: N/A;    Medications reviewed. Current Outpatient Prescriptions  Medication Sig Dispense Refill  . HYDROcodone-acetaminophen (NORCO) 5-325 MG per tablet Take 1-2 tablets by mouth every 6  (six) hours as needed.  30 tablet  0  . levothyroxine (SYNTHROID, LEVOTHROID) 50 MCG tablet Take 50 mcg by mouth daily before breakfast.      . pravastatin (PRAVACHOL) 20 MG tablet Take 20 mg by mouth at bedtime.       No current facility-administered medications for this visit.   Facility-Administered Medications Ordered in Other Visits  Medication Dose Route Frequency Provider Last Rate Last Dose  . phenazopyridine (PYRIDIUM) tablet 200 mg  200 mg Oral Once Reece Packer, MD         Objective:   Physical Exam BP 119/71  Pulse 72  Temp(Src) 98.2 F (36.8 C) (Oral)  Ht 4\' 9"  (1.448 m)  Wt 127 lb 8 oz (57.834 kg)  BMI 27.58 kg/m2 Gen:  Alert, cooperative patient who appears stated age in no acute distress.  Vital signs reviewed.  Well-developed and well nourished.  No ill appearing.  HEENT: EOMI,  MMM Heart:  RRR Lungs:  CLear throughout.  Entire exam conducted with interpreter present.   No results found for this or any previous visit (from the past 72 hour(s)).

## 2013-07-20 NOTE — Patient Instructions (Signed)
We will schedule you for a CT scan of your chest.  It will take a day or two to call the results to you.  It was good to see you today

## 2013-07-23 ENCOUNTER — Ambulatory Visit (HOSPITAL_COMMUNITY)
Admission: RE | Admit: 2013-07-23 | Discharge: 2013-07-23 | Disposition: A | Discharge status: Home / Self Care | Payer: No Typology Code available for payment source | Source: Ambulatory Visit | Attending: Family Medicine | Admitting: Family Medicine

## 2013-07-23 DIAGNOSIS — R911 Solitary pulmonary nodule: Secondary | ICD-10-CM | POA: Insufficient documentation

## 2013-07-26 ENCOUNTER — Telehealth: Payer: Self-pay | Admitting: Family Medicine

## 2013-07-26 DIAGNOSIS — R911 Solitary pulmonary nodule: Secondary | ICD-10-CM

## 2013-07-26 NOTE — Telephone Encounter (Signed)
Called and spoke with patient's daughter Juliene Pina re: results of CT chest.  Explained the 8 mm nodule on CXR was just shadow.  7 mm nodule likely secondary to granulomatous disease and not as worrisome.  Recommended repeat scan in 1 year to evaluate for any growth.  Patient expressed relief and gratitude for call.    Will forward to PCP.

## 2013-08-15 ENCOUNTER — Other Ambulatory Visit: Payer: Self-pay | Admitting: Family Medicine

## 2013-08-17 ENCOUNTER — Other Ambulatory Visit: Payer: Self-pay | Admitting: Family Medicine

## 2013-09-17 ENCOUNTER — Other Ambulatory Visit: Payer: Self-pay | Admitting: Family Medicine

## 2013-10-18 ENCOUNTER — Other Ambulatory Visit: Payer: Self-pay | Admitting: Family Medicine

## 2013-11-17 ENCOUNTER — Other Ambulatory Visit: Payer: Self-pay | Admitting: Family Medicine

## 2013-12-03 ENCOUNTER — Encounter: Payer: Self-pay | Admitting: Family Medicine

## 2013-12-03 ENCOUNTER — Ambulatory Visit (INDEPENDENT_AMBULATORY_CARE_PROVIDER_SITE_OTHER): Payer: No Typology Code available for payment source | Admitting: Family Medicine

## 2013-12-03 VITALS — BP 126/71 | HR 61 | Temp 98.1°F | Wt 126.6 lb

## 2013-12-03 DIAGNOSIS — E785 Hyperlipidemia, unspecified: Secondary | ICD-10-CM

## 2013-12-03 DIAGNOSIS — E039 Hypothyroidism, unspecified: Secondary | ICD-10-CM

## 2013-12-03 LAB — COMPREHENSIVE METABOLIC PANEL
ALBUMIN: 4.2 g/dL (ref 3.5–5.2)
ALT: 11 U/L (ref 0–35)
AST: 16 U/L (ref 0–37)
Alkaline Phosphatase: 68 U/L (ref 39–117)
BUN: 14 mg/dL (ref 6–23)
CALCIUM: 9.2 mg/dL (ref 8.4–10.5)
CHLORIDE: 103 meq/L (ref 96–112)
CO2: 25 mEq/L (ref 19–32)
Creat: 0.61 mg/dL (ref 0.50–1.10)
Glucose, Bld: 90 mg/dL (ref 70–99)
Potassium: 4.6 mEq/L (ref 3.5–5.3)
SODIUM: 140 meq/L (ref 135–145)
TOTAL PROTEIN: 7.2 g/dL (ref 6.0–8.3)
Total Bilirubin: 0.5 mg/dL (ref 0.2–1.2)

## 2013-12-03 LAB — CBC
HCT: 38.5 % (ref 36.0–46.0)
Hemoglobin: 13 g/dL (ref 12.0–15.0)
MCH: 28 pg (ref 26.0–34.0)
MCHC: 33.8 g/dL (ref 30.0–36.0)
MCV: 83 fL (ref 78.0–100.0)
PLATELETS: 277 10*3/uL (ref 150–400)
RBC: 4.64 MIL/uL (ref 3.87–5.11)
RDW: 14.3 % (ref 11.5–15.5)
WBC: 7.6 10*3/uL (ref 4.0–10.5)

## 2013-12-03 LAB — LIPID PANEL
Cholesterol: 232 mg/dL — ABNORMAL HIGH (ref 0–200)
HDL: 51 mg/dL (ref >39)
LDL CALC: 150 mg/dL — AB (ref 0–99)
Total CHOL/HDL Ratio: 4.5 Ratio
Triglycerides: 156 mg/dL — ABNORMAL HIGH (ref <150)
VLDL: 31 mg/dL (ref 0–40)

## 2013-12-03 NOTE — Progress Notes (Signed)
Interpreter Lesle Chris for Dr Mingo Amber

## 2013-12-03 NOTE — Assessment & Plan Note (Signed)
Lipids today. Refill pravastatin

## 2013-12-03 NOTE — Progress Notes (Signed)
Subjective:    Sara Haas is a 58 y.o. female who presents to [facility] today for medication refill:  1.  Hypothyroidism:  Chronic problem for over 20 years.  Denies any heat/cold intolerance, palpitations, weight change.  Has been out of thyroid meds for about 2 weeks.  Last checked TSH in 2011.    2. HLD:  Last lipid panel listed below.    Currently is on pravastatin but out of this for several weeks.  Has not eaten anything today.  When on her statin, she denied any myalgias, icterus, jaundice.  Tolerating medications well.   Lab Results  Component Value Date   CHOL 143 09/27/2009   CHOL 232* 07/17/2009   CHOL 204* 03/24/2008   Lab Results  Component Value Date   HDL 57 09/27/2009   HDL 58 07/17/2009   HDL 60 03/24/2008   Lab Results  Component Value Date   LDLCALC 67 09/27/2009   LDLCALC 156* 07/17/2009   LDLCALC 128* 03/24/2008   Lab Results  Component Value Date   TRIG 93 09/27/2009   TRIG 92 07/17/2009   TRIG 82 03/24/2008   Lab Results  Component Value Date   CHOLHDL 2.5 Ratio 09/27/2009   CHOLHDL 4.0 Ratio 07/17/2009   CHOLHDL 3.4 Ratio 03/24/2008      ROS as above per HPI, otherwise neg.  Pertinently, no chest pain, palpitations, SOB, Fever, Chills, Abd pain, N/V/D.   The following portions of the patient's history were reviewed and updated as appropriate: allergies, current medications, past medical history, family and social history, and problem list. Patient is a nonsmoker.    PMH reviewed.  Past Medical History  Diagnosis Date  . Thyroid disease     hypothyroidism  . Hyperlipidemia   . Asthma    Past Surgical History  Procedure Laterality Date  . Cystocele repair  2009  . Abdominal hysterectomy    . Bladder surgery    . Anterior and posterior repair N/A 05/18/2013    Procedure:  REPAIR OF RECTOCELE WITH GRAFT,REPAIR OF ENTEROCELE ;  Surgeon: Reece Packer, MD;  Location: [facility];  Service: Urology;  Laterality: N/A;  . Vaginal prolapse repair N/A 05/18/2013     Procedure:  VAULT PROLAPSE ;  Surgeon: Reece Packer, MD;  Location: [facility];  Service: Urology;  Laterality: N/A;  . Cystoscopy N/A 05/18/2013    Procedure: CYSTOSCOPY;  Surgeon: Reece Packer, MD;  Location: [facility];  Service: Urology;  Laterality: N/A;    Medications reviewed. Current Outpatient Prescriptions  Medication Sig Dispense Refill  . albuterol (PROVENTIL HFA;VENTOLIN HFA) 108 (90 BASE) MCG/ACT inhaler Inhale 2 puffs into the lungs every 6 (six) hours as needed for wheezing or shortness of breath.  1 Inhaler  0  . HYDROcodone-acetaminophen (NORCO) 5-325 MG per tablet Take 1-2 tablets by mouth every 6 (six) hours as needed.  30 tablet  0  . levothyroxine (SYNTHROID, LEVOTHROID) 50 MCG tablet TAKE ONE TABLET BY MOUTH ONCE DAILY  30 tablet  0  . pravastatin (PRAVACHOL) 20 MG tablet TAKE ONE TABLET BY MOUTH ONCE DAILY  30 tablet  0  . pravastatin (PRAVACHOL) 20 MG tablet TAKE ONE TABLET BY MOUTH ONCE DAILY  30 tablet  0   No current facility-administered medications for this visit.   Facility-Administered Medications Ordered in Other Visits  Medication Dose Route Frequency Provider Last Rate Last Dose  . phenazopyridine (PYRIDIUM) tablet 200 mg  200 mg Oral Once Reece Packer, MD  Objective:   Physical Exam BP 126/71  Pulse 61  Temp(Src) 98.1 F (36.7 C) (Oral)  Wt 126 lb 9.6 oz (57.425 kg) Gen:  Alert, cooperative patient who appears stated age in no acute distress.  Vital signs reviewed. HEENT: EOMI,  MMM Neck:  Firm 1 cm thyroid nodule noted Left upper lobe Cardiac:  Regular rate and rhythm without murmur auscultated.   Pulm:  Clear to auscultation bilaterally with good air movement.  Abd:  Soft/nondistended/nontender.  Good bowel sounds throughout all four quadrants.  No masses noted.  Exts: No LE edema  No results found for this or any previous visit (from the past 72 hour(s)).

## 2013-12-03 NOTE — Patient Instructions (Signed)
It was good to see you again today.  We will do blood work today.  I will send in your medicines after your blood tests come back.  We will get you set up for a thyroid ultrasound today.

## 2013-12-03 NOTE — Assessment & Plan Note (Signed)
Check TSH today.   Will adjust dosage if necessary -- will need to FU in 6 weeks for recheck as she has been off her meds about 6 weeks.

## 2013-12-04 ENCOUNTER — Other Ambulatory Visit: Payer: Self-pay | Admitting: Family Medicine

## 2013-12-04 LAB — TSH: TSH: 59.979 u[IU]/mL — ABNORMAL HIGH (ref 0.350–4.500)

## 2013-12-04 MED ORDER — LEVOTHYROXINE SODIUM 50 MCG PO TABS: ORAL_TABLET | ORAL | Status: DC

## 2013-12-04 MED ORDER — PRAVASTATIN SODIUM 20 MG PO TABS: 20.0000 mg | ORAL_TABLET | Freq: Every day | ORAL | Status: DC

## 2013-12-06 ENCOUNTER — Telehealth: Payer: Self-pay | Admitting: Family Medicine

## 2013-12-06 NOTE — Telephone Encounter (Signed)
Called and spoke with daughter.  Relayed hypothyroidism and hyperlipidemia results.  New scripts already sent.  Daughter expressed understanding.  No questions.

## 2013-12-08 ENCOUNTER — Ambulatory Visit (HOSPITAL_COMMUNITY)
Admission: RE | Admit: 2013-12-08 | Discharge: 2013-12-08 | Disposition: A | Discharge status: Home / Self Care | Payer: Self-pay | Source: Ambulatory Visit | Attending: Family Medicine | Admitting: Family Medicine

## 2013-12-08 DIAGNOSIS — E039 Hypothyroidism, unspecified: Secondary | ICD-10-CM | POA: Insufficient documentation

## 2013-12-13 ENCOUNTER — Encounter: Payer: Self-pay | Admitting: Family Medicine

## 2013-12-13 ENCOUNTER — Telehealth: Payer: Self-pay | Admitting: Family Medicine

## 2013-12-13 NOTE — Telephone Encounter (Signed)
Called and discussed with patient's daughter results of negative [place].  Patient is doing well.  Daughter appreciative of call.

## 2013-12-28 ENCOUNTER — Ambulatory Visit: Payer: Self-pay

## 2014-01-14 ENCOUNTER — Ambulatory Visit (INDEPENDENT_AMBULATORY_CARE_PROVIDER_SITE_OTHER): Payer: Self-pay | Admitting: Family Medicine

## 2014-01-14 ENCOUNTER — Encounter: Payer: Self-pay | Admitting: Family Medicine

## 2014-01-14 VITALS — BP 130/66 | HR 70 | Temp 98.3°F | Ht <= 58 in | Wt 121.7 lb

## 2014-01-14 DIAGNOSIS — E785 Hyperlipidemia, unspecified: Secondary | ICD-10-CM

## 2014-01-14 DIAGNOSIS — Z0000 Encounter for general adult medical examination without abnormal findings: Secondary | ICD-10-CM | POA: Insufficient documentation

## 2014-01-14 DIAGNOSIS — E039 Hypothyroidism, unspecified: Secondary | ICD-10-CM

## 2014-01-14 DIAGNOSIS — Z23 Encounter for immunization: Secondary | ICD-10-CM

## 2014-01-14 LAB — TSH: TSH: 12.011 u[IU]/mL — AB (ref 0.350–4.500)

## 2014-01-14 NOTE — Patient Instructions (Signed)
We have given you information on colonoscopies and mammogram.  We are rechecking your thyroid today.  Someone will call with those results.  It was good to see you again today!  Come back in about 6 months to see how you're doing.

## 2014-01-14 NOTE — Assessment & Plan Note (Signed)
Continue current dose of Synthroid May need increase in dose Checking TSH today

## 2014-01-14 NOTE — Assessment & Plan Note (Signed)
May need increase in Pravastatin.  Recheck next visit.

## 2014-01-14 NOTE — Progress Notes (Signed)
Subjective:    Sara Haas is a 58 y.o. female who presents to [facility] today for thyroid recheck:  1.  Hypothyroid: Had been out of her meds for some time last visit.  TSH was ~59.  She had her meds filled and has been taking them for a little over a month now.  Has been doing well since restarting meds.  See ROS below.    ROS as above per HPI, otherwise neg.  No chest pain, dyspnea, palpitations, SOB, heat/cold intolerance.  The following portions of the patient's history were reviewed and updated as appropriate: allergies, current medications, past medical history, family and social history, and problem list. Patient is a nonsmoker.    PMH reviewed.  Past Medical History  Diagnosis Date  . Thyroid disease     hypothyroidism  . Hyperlipidemia   . Asthma    Past Surgical History  Procedure Laterality Date  . Cystocele repair  2009  . Abdominal hysterectomy    . Bladder surgery    . Anterior and posterior repair N/A 05/18/2013    Procedure:  REPAIR OF RECTOCELE WITH GRAFT,REPAIR OF ENTEROCELE ;  Surgeon: Reece Packer, MD;  Location: [facility];  Service: Urology;  Laterality: N/A;  . Vaginal prolapse repair N/A 05/18/2013    Procedure:  VAULT PROLAPSE ;  Surgeon: Reece Packer, MD;  Location: [facility];  Service: Urology;  Laterality: N/A;  . Cystoscopy N/A 05/18/2013    Procedure: CYSTOSCOPY;  Surgeon: Reece Packer, MD;  Location: [facility];  Service: Urology;  Laterality: N/A;  . Thyroidectomy      Medications reviewed. Current Outpatient Prescriptions  Medication Sig Dispense Refill  . albuterol (PROVENTIL HFA;VENTOLIN HFA) 108 (90 BASE) MCG/ACT inhaler Inhale 2 puffs into the lungs every 6 (six) hours as needed for wheezing or shortness of breath.  1 Inhaler  0  . levothyroxine (SYNTHROID, LEVOTHROID) 50 MCG tablet TAKE ONE TABLET BY MOUTH ONCE DAILY  30 tablet  1  . pravastatin (PRAVACHOL) 20 MG tablet Take 1 tablet (20 mg total) by mouth daily.  90 tablet  3    No current facility-administered medications for this visit.     Objective:   Physical Exam BP 130/66  Pulse 70  Temp(Src) 98.3 F (36.8 C) (Oral)  Ht 4\' 10"  (1.473 m)  Wt 121 lb 11.2 oz (55.203 kg)  BMI 25.44 kg/m2 Gen:  Alert, cooperative patient who appears stated age in no acute distress.  Vital signs reviewed. HEENT: EOMI,  MMM Neck: No thyromegaly/goiter Cardiac:  Regular rate and rhythm without murmur auscultated.   Pulm:  Clear to auscultation bilaterally with good air movement.   Ext:  No edema noted.   No results found for this or any previous visit (from the past 72 hour(s)).

## 2014-01-14 NOTE — Assessment & Plan Note (Signed)
Needs mammogram/colonoscopy.  Info given today as she has [place].  Needs to schedule on her own.  Flu shot today.

## 2014-01-17 ENCOUNTER — Telehealth: Payer: Self-pay | Admitting: Family Medicine

## 2014-01-17 MED ORDER — LEVOTHYROXINE SODIUM 75 MCG PO TABS: 75.0000 ug | ORAL_TABLET | Freq: Every day | ORAL | Status: DC

## 2014-01-17 NOTE — Telephone Encounter (Signed)
Called and relayed TSH.  Will increase synthroid to 75 mcg and recheck in 6 weeks.  Daughter expressed understanding and will call to schedule.

## 2014-01-21 ENCOUNTER — Other Ambulatory Visit: Payer: Self-pay | Admitting: Family Medicine

## 2014-01-21 DIAGNOSIS — Z1231 Encounter for screening mammogram for malignant neoplasm of breast: Secondary | ICD-10-CM

## 2014-02-07 ENCOUNTER — Ambulatory Visit (HOSPITAL_COMMUNITY)
Admission: RE | Admit: 2014-02-07 | Discharge: 2014-02-07 | Disposition: A | Discharge status: Home / Self Care | Payer: Self-pay | Source: Ambulatory Visit | Attending: Family Medicine | Admitting: Family Medicine

## 2014-02-07 DIAGNOSIS — Z1231 Encounter for screening mammogram for malignant neoplasm of breast: Secondary | ICD-10-CM

## 2014-02-21 ENCOUNTER — Encounter: Payer: Self-pay | Admitting: Family Medicine

## 2014-06-28 ENCOUNTER — Ambulatory Visit: Payer: Self-pay

## 2014-07-22 ENCOUNTER — Encounter: Payer: Self-pay | Admitting: Family Medicine

## 2014-07-22 ENCOUNTER — Ambulatory Visit (INDEPENDENT_AMBULATORY_CARE_PROVIDER_SITE_OTHER): Payer: Self-pay | Admitting: Family Medicine

## 2014-07-22 VITALS — BP 134/83 | HR 80 | Temp 98.5°F | Ht 60.0 in | Wt 125.2 lb

## 2014-07-22 DIAGNOSIS — M79605 Pain in left leg: Secondary | ICD-10-CM

## 2014-07-22 DIAGNOSIS — M25512 Pain in left shoulder: Secondary | ICD-10-CM

## 2014-07-22 DIAGNOSIS — R29810 Facial weakness: Secondary | ICD-10-CM

## 2014-07-22 NOTE — Patient Instructions (Addendum)
Good to see you today. We did a shoulder joint injection. These cannot be done more often than every 3 months. You can use ibuprofen 4-600 mg 2-3 times a day for 1-2 weeks with food. Stop if you are having stomach burning. Do range of motion exercises with shoulder. Avoid any overhead lifting. Follow-up in about 1-2 weeks with Dr. Mingo Amber to see if you're doing better.   Your legs did not have any signs of infection or clot today. Please return to further evaluate the pain.   With your slight facial droop and ear symptoms, along with some blurry vision, we are ordering a brain MRI. Please continue to take pravastatin every day. Take aspirin every day 81 mg if you do not have any stomach burning issues. Follow-up about this as well with Dr. Mingo Amber in 1-2 weeks.

## 2014-07-22 NOTE — Progress Notes (Signed)
Patient ID: Sara Haas, female   DOB: Sara 02, 1957, 60 y.o.   MRN: [id number] Subjective:   CC: Left sided abnormality  HPI:   History obtained in Spanish with interpreter present.  Patient presents to sameday clinic with "abnormality of my entire left side" since November. On further discussion, she has had left facial abnormality, left arm pain, and left leg pain.  Left sided facial droop She reports that for 2 months she has felt like her left side of her face feels "weird" along with the ear feeling "strange" though she is unable to further qualify. She denies pain or obvious weakness. However, 2-3 weeks ago her daughter noticed drooping in left eyelid and corner of mouth, mild. She denies rash, change in gait or speech, but she has had some blurry vision. She denies head trauma or h/o stroke, though she reports some family members have had heart problems and stroke. She has also felt vertiginous with sudden movements like turning her head or laying down quickly. She denies lightheadedness, syncope, or chest pain. No falls. No recent URI symptoms.   Left shoulder pain Patient states she has had left entire arm pain that is worst in her shoulder since November and came on suddenly without obvious trigger or trauma. Pain limits her ROM in any direction, especially flex and abduct, which causes severe shooting pain down entire arm and burning into wrist. She is unable to localize more than this. She denies neck pain but has some left shoulder blade pain. She has never had neck or shoulder issues before. Pain has increased in last 3 weeks. Massage helps. Denies swelling, chest pain, dyspnea. Has not tried medication.   Left leg pain She also reports left leg has also been hurting x 2 months and both knees hurt. She denies  redness, swelling, or weakness, rash, fevers, or chills.   Review of Systems - Per HPI.   PMH - abdominal hernia, asthma, bladder prolapse, hyperlipidemia,  hypothyroidism, mixed incontinence, rectocele, solitary pulmonary nodule Smoking status: Never smoker    Objective:  Physical Exam BP 134/83 mmHg  Pulse 80  Temp(Src) 98.5 F (36.9 C) (Oral)  Ht 5' (1.524 m)  Wt 125 lb 4 oz (56.813 kg)  BMI 24.46 kg/m2 GEN: NAD Cardiovascular: Regular rate and rhythm, no murmurs rubs or gallops, no carotid bruits, 2+ bilateral radial pulses Pulmonary: Clear to auscultation bilaterally, normal effort Abdomen: Soft, nontender, nondistended MSK/ Extremities: No lower extremity edema or calf tenderness, no erythema and no induration or asymmetry Left shoulder with no erythema or deformity, tenderness present to palpation along supraspinatus area, decreased range of motion at fwd flexion and abduction to about 100, impingement signs present with weakness and reproduced pain to empty can and hawkins on left No spinal tenderness; Negative spurlings, full c-spine ROM HEENT: Atraumatic, normocephalic, sclera clear, extraocular movements intact, TMs clear bilaterally but with mild clear retraction on the left, no vesicles or rash, no obvious mass, neck supple, no lymphadenopathy, oropharynx clear with moist mucous membranes, PERRLA, no proptosis, photosensitivity, swelling or erythema around eyes Neuro: Awake, alert, subtle facial droop involving entire left side of face including forehead, most notable at upper eyelid and corner of mouth, cranial nerve II through XII tested and intact, normal sensation bilaterally to gross touch, gross hearing equal bilaterally, Bilateral upper extremity strength appears relatively symmetric except for left shoulder which has severe pain; normal speech and gait; EOMI, pERRLA, normal coordination SKIN: No rash or cyanosis  Procedure:  Injection of left subacromial  space Consent obtained and verified. Time-out conducted. Noted no overlying erythema, induration, or other signs of local infection. Skin prepped in a sterile  fashion. Topical analgesic spray: Ethyl chloride. Completed without difficulty. Meds: depomedrol 40mg  1cc and lidocaine 1% 3cc Pain immediately improved suggesting accurate placement of the medication. Advised to call if fevers/chills, erythema, induration, drainage, or persistent bleeding.  Assessment:     Sara Haas is a 59 y.o. female here for mild facial droop and left arm pain.    Plan:     # See problem list and after visit summary for problem-specific plans. - Precepted and examined with Dr Ree Kida.  # Health Maintenance: Not discussed  Follow-up: Follow up in 2 weeks for f/u of above symptoms with PCP.   Hilton Sinclair, MD [place]

## 2014-07-23 DIAGNOSIS — M79605 Pain in left leg: Secondary | ICD-10-CM | POA: Insufficient documentation

## 2014-07-23 DIAGNOSIS — R29810 Facial weakness: Secondary | ICD-10-CM | POA: Insufficient documentation

## 2014-07-23 DIAGNOSIS — M25512 Pain in left shoulder: Secondary | ICD-10-CM | POA: Insufficient documentation

## 2014-07-23 NOTE — Assessment & Plan Note (Addendum)
Present at least 2 weeks, likely longer per hx. Constellation of symptoms including left mild facial droop involving entire face, blurry vision, "strange" sensation in ear with vertigo, with normal neurologic exam. Concern for recent stroke within past 2 months when symptoms began. Bells palsy more likely but would not be associated with blurred vision or vertigo. Consider schwannoma as well. Mild left serous otitis media may be contributing to ear/vertigo symptoms. - Brain MRI without and with contrast - discussed with radiology who recommends this to eval for schwannoma. Most recent Cr normal 8/15; will recheck at MRI appt 4/8. - Defer further stroke workup to PCP, but discussed taking daily asa 81mg  and statin.  - Expectantly manage for improvement which would be expected with bells palsy - F/u 1-2 weeks with PCP

## 2014-07-23 NOTE — Assessment & Plan Note (Addendum)
At least 2 months of pain. Did not have sufficient time to fully evaluate leg pain but left leg appears WNL on exam with no erythema, swelling, rash, tenderness, or asymmetry making cellulitis, DVT, or other acute issue unlikely. Suspicion for knee OA contributing. - F/u with PCP to discuss.

## 2014-07-23 NOTE — Assessment & Plan Note (Signed)
Left shoulder pain 4 months, worsened last 3 weeks, with positive impingement signs on exam and some element of adhesive capsulitis. Also with tenderness along supraspinatus and generalized difficulty with exam due to severe pain. Likely degenerative rotator cuff tendinopathy. Although some radicular component of pain, neg spurlings and no spinal tenderness or neck ROM issues. - Subacromial injection per procedure note. Tolerated well and had some immediate relief. - ROM exercises including walking hand up wall discussed for impingement. Avoid overhead lifting. - Ibuprofen 4-600mg  PO 2-3x/day 1-2 weeks with food, stop if stomach irritation. Nl Creatinine. - Left shoulder xray to eval for arthritic changes. - Return in 2 weeks for f/u.

## 2014-07-29 ENCOUNTER — Ambulatory Visit (HOSPITAL_COMMUNITY): Payer: Self-pay

## 2014-07-29 ENCOUNTER — Ambulatory Visit (HOSPITAL_COMMUNITY): Admission: RE | Admit: 2014-07-29 | Payer: Self-pay | Source: Ambulatory Visit

## 2014-08-08 ENCOUNTER — Ambulatory Visit (HOSPITAL_COMMUNITY)
Admission: RE | Admit: 2014-08-08 | Discharge: 2014-08-08 | Disposition: A | Discharge status: Home / Self Care | Payer: Self-pay | Source: Ambulatory Visit | Attending: Family Medicine | Admitting: Family Medicine

## 2014-08-08 DIAGNOSIS — M25512 Pain in left shoulder: Secondary | ICD-10-CM | POA: Insufficient documentation

## 2014-08-09 ENCOUNTER — Ambulatory Visit (HOSPITAL_COMMUNITY)
Admission: RE | Admit: 2014-08-09 | Discharge: 2014-08-09 | Disposition: A | Discharge status: Home / Self Care | Payer: Self-pay | Source: Ambulatory Visit | Attending: Family Medicine | Admitting: Family Medicine

## 2014-08-09 ENCOUNTER — Encounter: Payer: Self-pay | Admitting: Family Medicine

## 2014-08-09 DIAGNOSIS — R51 Headache: Secondary | ICD-10-CM | POA: Insufficient documentation

## 2014-08-09 DIAGNOSIS — H9190 Unspecified hearing loss, unspecified ear: Secondary | ICD-10-CM | POA: Insufficient documentation

## 2014-08-09 DIAGNOSIS — R29810 Facial weakness: Secondary | ICD-10-CM | POA: Insufficient documentation

## 2014-08-09 DIAGNOSIS — I728 Aneurysm of other specified arteries: Secondary | ICD-10-CM | POA: Insufficient documentation

## 2014-08-09 LAB — POCT I-STAT CREATININE: Creatinine, Ser: 0.7 mg/dL (ref 0.50–1.10)

## 2014-08-09 MED ORDER — GADOBENATE DIMEGLUMINE 529 MG/ML IV SOLN
10.0000 mL | Freq: Once | INTRAVENOUS | Status: AC | PRN
  Administered 2014-08-09: 10 mL via INTRAVENOUS

## 2014-08-10 ENCOUNTER — Inpatient Hospital Stay (HOSPITAL_COMMUNITY)
Admission: AD | Admit: 2014-08-10 | Discharge: 2014-08-15 | DRG: 093 | Disposition: A | Discharge status: Home / Self Care | Payer: Self-pay | Source: Ambulatory Visit | Attending: Family Medicine | Admitting: Family Medicine

## 2014-08-10 ENCOUNTER — Encounter (HOSPITAL_COMMUNITY): Payer: Self-pay

## 2014-08-10 ENCOUNTER — Inpatient Hospital Stay (HOSPITAL_COMMUNITY): Payer: Self-pay

## 2014-08-10 ENCOUNTER — Telehealth: Payer: Self-pay | Admitting: *Deleted

## 2014-08-10 DIAGNOSIS — I726 Aneurysm of vertebral artery: Secondary | ICD-10-CM | POA: Diagnosis present

## 2014-08-10 DIAGNOSIS — E039 Hypothyroidism, unspecified: Secondary | ICD-10-CM | POA: Diagnosis present

## 2014-08-10 DIAGNOSIS — R531 Weakness: Secondary | ICD-10-CM

## 2014-08-10 DIAGNOSIS — I671 Cerebral aneurysm, nonruptured: Principal | ICD-10-CM | POA: Diagnosis present

## 2014-08-10 DIAGNOSIS — J45909 Unspecified asthma, uncomplicated: Secondary | ICD-10-CM | POA: Diagnosis present

## 2014-08-10 DIAGNOSIS — E785 Hyperlipidemia, unspecified: Secondary | ICD-10-CM | POA: Diagnosis present

## 2014-08-10 LAB — COMPREHENSIVE METABOLIC PANEL
ALBUMIN: 3.2 g/dL — AB (ref 3.5–5.2)
ALK PHOS: 82 U/L (ref 39–117)
ALT: 15 U/L (ref 0–35)
ANION GAP: 9 (ref 5–15)
AST: 21 U/L (ref 0–37)
BILIRUBIN TOTAL: 0.5 mg/dL (ref 0.3–1.2)
BUN: 14 mg/dL (ref 6–23)
CHLORIDE: 106 mmol/L (ref 96–112)
CO2: 26 mmol/L (ref 19–32)
Calcium: 9 mg/dL (ref 8.4–10.5)
Creatinine, Ser: 0.77 mg/dL (ref 0.50–1.10)
GFR calc non Af Amer: 89 mL/min — ABNORMAL LOW (ref >90)
Glucose, Bld: 88 mg/dL (ref 70–99)
POTASSIUM: 4.6 mmol/L (ref 3.5–5.1)
SODIUM: 141 mmol/L (ref 135–145)
Total Protein: 6.9 g/dL (ref 6.0–8.3)

## 2014-08-10 LAB — CBC
HCT: 38.2 % (ref 36.0–46.0)
HEMOGLOBIN: 12.4 g/dL (ref 12.0–15.0)
MCH: 28.1 pg (ref 26.0–34.0)
MCHC: 32.5 g/dL (ref 30.0–36.0)
MCV: 86.4 fL (ref 78.0–100.0)
Platelets: 238 10*3/uL (ref 150–400)
RBC: 4.42 MIL/uL (ref 3.87–5.11)
RDW: 13.3 % (ref 11.5–15.5)
WBC: 9.6 10*3/uL (ref 4.0–10.5)

## 2014-08-10 LAB — PROTIME-INR
INR: 0.96 (ref 0.00–1.49)
PROTHROMBIN TIME: 12.9 s (ref 11.6–15.2)

## 2014-08-10 LAB — TSH: TSH: 0.472 u[IU]/mL (ref 0.350–4.500)

## 2014-08-10 MED ORDER — ACETAMINOPHEN 325 MG PO TABS: 650.0000 mg | ORAL_TABLET | Freq: Four times a day (QID) | ORAL | Status: DC | PRN

## 2014-08-10 MED ORDER — ONDANSETRON HCL 4 MG/2ML IJ SOLN: 4.0000 mg | Freq: Four times a day (QID) | INTRAMUSCULAR | Status: DC | PRN

## 2014-08-10 MED ORDER — ONDANSETRON HCL 4 MG PO TABS
4.0000 mg | ORAL_TABLET | Freq: Four times a day (QID) | ORAL | Status: DC | PRN
Start: 2014-08-10 — End: 2014-08-15
  Administered 2014-08-13: 4 mg via ORAL
  Filled 2014-08-10: qty 1

## 2014-08-10 MED ORDER — SODIUM CHLORIDE 0.9 % IJ SOLN
3.0000 mL | Freq: Two times a day (BID) | INTRAMUSCULAR | Status: DC
  Administered 2014-08-10 – 2014-08-15 (×7): 3 mL via INTRAVENOUS

## 2014-08-10 MED ORDER — SODIUM CHLORIDE 0.9 % IV SOLN: 250.0000 mL | INTRAVENOUS | Status: DC | PRN

## 2014-08-10 MED ORDER — LEVOTHYROXINE SODIUM 50 MCG PO TABS: 75.0000 ug | ORAL_TABLET | Freq: Every day | ORAL | Status: DC

## 2014-08-10 MED ORDER — ACETAMINOPHEN 650 MG RE SUPP: 650.0000 mg | Freq: Four times a day (QID) | RECTAL | Status: DC | PRN

## 2014-08-10 MED ORDER — SODIUM CHLORIDE 0.9 % IJ SOLN: 3.0000 mL | INTRAMUSCULAR | Status: DC | PRN

## 2014-08-10 MED ORDER — LEVOTHYROXINE SODIUM 50 MCG PO TABS
75.0000 ug | ORAL_TABLET | Freq: Every day | ORAL | Status: DC
  Administered 2014-08-11 – 2014-08-15 (×5): 75 ug via ORAL
  Filled 2014-08-10 (×10): qty 1

## 2014-08-10 MED ORDER — PRAVASTATIN SODIUM 20 MG PO TABS
20.0000 mg | ORAL_TABLET | Freq: Every day | ORAL | Status: DC
  Administered 2014-08-10 – 2014-08-15 (×6): 20 mg via ORAL
  Filled 2014-08-10 (×6): qty 1

## 2014-08-10 MED ORDER — ALBUTEROL SULFATE (2.5 MG/3ML) 0.083% IN NEBU: 2.5000 mg | INHALATION_SOLUTION | Freq: Four times a day (QID) | RESPIRATORY_TRACT | Status: DC | PRN

## 2014-08-10 NOTE — Telephone Encounter (Signed)
Spoke with daughter and patient is going to 4N19C.  She voiced understanding and will bring mom to admitting. Jazmin Hartsell,CMA

## 2014-08-10 NOTE — Progress Notes (Signed)
Pt directly admitted to 4N21. Pt Spanish speaking; daughter at bedside. Pt alert and oriented x4. Attending MD paged. Awaiting orders. Pt oriented to room; call bell in reach and bed alarm on. Will continue to monitor.

## 2014-08-10 NOTE — Telephone Encounter (Signed)
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 There is slight mass effect on the left ventral medulla.  Formal catheter angiogram could be helpful in further evaluation.  Report is in [place].  Will inform PCP. Derl Barrow, RN

## 2014-08-10 NOTE — Telephone Encounter (Signed)
[facility] Team please call daughter Berdine Addison (mobile number listed) once bed available.  Documentation:  Spoke with daughter Berdine Addison who is aware of results and that she will be called when bed for her mother is available at [facility] (telemetry bed) and reason for direct admission is further imaging suggested in MRI result and likely formal neuro consult. She voices understanding. She reports the patient has been feeling well. They have a f/u appt 4/22 and I asked them to call and reschedule tomorrow if it seemed she would remain in hospital.  Notified Dr Gerlean Ren with inpatient [facility] team that they or cross cover resident would be notified once patient had bed and arrived.  Forwarding this note to PCP to [facility]   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 The aneurysm appears partially thrombosed. There is slight mass effect on the LEFT ventral medulla.  Formal catheter angiogram could be helpful in further evaluation.  These results will be called to the ordering clinician or representative by the Radiologist Assistant, and communication documented in the PACS or zVision Dashboard.

## 2014-08-10 NOTE — Progress Notes (Signed)
[facility] Admission History and Physical Service Pager: [phone]  Patient name: Sara Haas Medical record number: [id number] Date of birth: [birth year] Age: 59 y.o. Gender: female  Primary Care Provider: Annabell Sabal, MD Consultants: Neurology Code Status: Full  Chief Complaint: Left Vertebral Artery Aneurysm  Assessment and Plan: Sara Haas is a 59 y.o. 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 PMH is significant for hypothyroidism, asthma, HLD, mixed incontinence.  # Left Vertebral Artery Aneurysm:  Noted to have left sided pain and weakness since November 2015. 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 Direct admission. Neurology consulted and recommended Neurosurgery consult. Neurosurgery consulted and awaiting recommendations - Admitted to [place], [facility] attending - Follow up CMP, CBC, TSH, PT/INR - Follow up Neurosurgery recommendations - PT/OT - Neuro surgery recs: NPO after midnight tonight, SCD's. MRI cervical spine. Pt may go for catheter angiogram tomorrow. We appreciated their recommendations.   # Hyperlipidemia: Home medication includes Pravastatin 20mg . Last lipid panel 11/2013 with cholesterol 232, triglycerides 156, HDL 51, LDL 150. - Continue home medication  # Asthma: Home medication includes Albuterol - Continue home medication  # Hypothyroidism:  Home medication includes synthroid 87mcg daily. TSH 12.011 in 12/2013 - Follow up TSH - Continue home medication  FEN/GI: Regular Diet, Saline Lock Prophylaxis: SCD's  Disposition:  Admitted to [place], [place] attending  History of Present Illness: Sara Haas is a 59 y.o. female [place] speaking female, presenting with 6 month history of left sided pain (head, arm, leg) with occasional weakness and blurred vision bilaterally worse on left side associated with left sided headache. Pain and weakness started in left shoulder and then spread to left face and head (mostly eye and ear), and then to her left leg. States symptoms appeared suddenly in November and were associated with a headache. Family members noted facial asymmetry of left eye and mouth. Shoulder pain improved with subacromial injection given in office on 4/1. Also complains of occasional dizziness. Notes occasional locking of fingers in right hand. States other symptoms are unchanged since November. Still able to ambulate, but complains of weakness and  bilateral knee pain. Denies problems swallowing. Denies history of smoking. Doe snot take ASA.  Initially presented to office visit on 4/1 with "abnormality of my entire left side" since November 2015. Noted to have left facial abnormality, left arm pain, and left leg pain. Denied weakness. No head trauma or history of stroke. Has noted left arm pain worse in shoulder since November that started suddenly without obvious trigger or trauma. Neurological exam at office visit normal. Brain MRI with and without contrast ordered. Subacromial injection given with immediate relief of shoulder pain.   Today results of MRI was sent to office noting left vertebral artery aneurysm which appeared partially thrombosed. Slight mass effect on left ventral medulla. Radiology recommended formal catheter angiogram. Sara Haas contacted and instructed to come to [facility] for direct admission.   Visit conducted with aid of Spanish Interpreter on [employer].  Review Of Systems: Per HPI  Otherwise 12 point review of systems was performed and was  unremarkable.  Patient Active Problem List   Diagnosis  Date Noted  . Facial droop 07/23/2014  . Left leg pain 07/23/2014  . Left shoulder pain 07/23/2014  . Preventative health care 01/14/2014  . Solitary pulmonary nodule 07/20/2013  . Rectocele 05/18/2013  . Abdominal hernia 12/18/2012  . Female bladder prolapse, acquired 10/21/2012  . Mixed incontinence 10/21/2012  . HLD (hyperlipidemia) 03/26/2012  . Asthma 03/25/2012  . MENOPAUSE, SURGICAL 12/10/2006  . HYPOTHYROIDISM 12/09/2006   Past Medical History: Past Medical History  Diagnosis Date  . Thyroid disease     hypothyroidism  . Hyperlipidemia   . Asthma    Past Surgical History: Past Surgical History  Procedure Laterality Date  . Cystocele repair  2009  . Abdominal hysterectomy    . Bladder surgery    . Anterior and posterior repair N/A 05/18/2013    Procedure:  REPAIR OF RECTOCELE WITH GRAFT,REPAIR OF ENTEROCELE ;  Surgeon: Reece Packer, MD;  Location: [facility]S;  Service: Urology;  Laterality: N/A;  . Vaginal prolapse repair N/A 05/18/2013    Procedure:  VAULT PROLAPSE ;  Surgeon: Reece Packer, MD;  Location: [facility];  Service: Urology;  Laterality: N/A;  . Cystoscopy N/A 05/18/2013    Procedure: CYSTOSCOPY;  Surgeon: Reece Packer, MD;  Location: [facility];  Service: Urology;  Laterality: N/A;  . Thyroidectomy     Social History: History  Substance Use Topics  . Smoking status: Never Smoker   . Smokeless tobacco: Never Used  . Alcohol Use: No   Please also refer to relevant sections of EMR.  Family History: Family History  Problem Relation Age of Onset  . Heart disease Mother   . Asthma Mother   . Stroke Father   . Heart disease Sister   . Heart disease Brother    Allergies and Medications: No Known Allergies No current facility-administered medications on file prior to encounter.   Current Outpatient Prescriptions on File Prior to Encounter  Medication Sig Dispense Refill  . albuterol  (PROVENTIL HFA;VENTOLIN HFA) 108 (90 BASE) MCG/ACT inhaler Inhale 2 puffs into the lungs every 6 (six) hours as needed for wheezing or shortness of breath. 1 Inhaler 0  . levothyroxine (SYNTHROID, LEVOTHROID) 75 MCG tablet Take 1 tablet (75 mcg total) by mouth daily. 90 tablet 3  . pravastatin (PRAVACHOL) 20 MG tablet Take 1 tablet (20 mg total) by mouth daily. 90 tablet 3    Objective: BP 122/76 mmHg  Pulse 83  Temp(Src) 98.2 F (36.8 C) (Oral)  Resp 18  SpO2 100% Exam: General: 59yo female resting comfortably in no apparent distress HEENT: Moist mucous membranes, PERRLA, atraumatic Cardiovascular: S1 and S2 noted, no murmurs/rubs/gallops Respiratory: Clear to auscultation bilaterally, no wheezes/rales/rhonci, no increased work of breathing Abdomen: soft and nondistended, bowel sounds noted, no tenderness or masses to palpation Extremities: No edema noted, pulses palpable bilaterally Skin: No rashes noted, warm, dry Neuro: AAO, facial droop noted on left, CN II-X intact,  muscle strength 5/5 in right upper and lower extremities, muscle strength 3+/5 in left hip flexor, 4+/5 grip strength left upper extremity; sensation intact, visual fields intact  Labs and Imaging: CBC BMET  No results for input(s): WBC, HGB, HCT, PLT in the last 168 hours.  Recent Labs Lab 08/09/14 1735  CREATININE 0.70     Mr Jeri Cos Wo Contrast  08/09/2014   CLINICAL DATA:  LEFT facial pain, hearing loss, blurred vision and LEFT facial droop. Symptoms for 2 months. Evaluate for ischemic stroke or schwannoma/malignancy. Initial encounter.  EXAM: MRI  HEAD WITHOUT AND WITH CONTRAST  TECHNIQUE: Multiplanar, multiecho pulse sequences of the brain and surrounding structures were obtained without and with intravenous contrast.  CONTRAST:  51mL MULTIHANCE GADOBENATE DIMEGLUMINE 529 MG/ML IV SOLN  COMPARISON:  CT of the cervical spine 09/10/2010.  FINDINGS: No evidence for acute infarction, hemorrhage, mass lesion,  hydrocephalus, or extra-axial fluid. Normal cerebral volume. No significant white matter disease given the patient's chronologic age of 34.  There is mass effect from an abnormal vascular structure near the LEFT foramen of Luschka. This structure is nearly spherical, measuring 13 x 14 x 15 mm contains variable enhancement, susceptibility indicating acute and chronic blood products, as well as central flow voids. It arises from the distal V4 segment LEFT vertebral artery adjacent to the LEFT PICA origin. Is unchanged from the previous CT of the cervical spine dated 09/10/2010 at which time it was described as likely representing a vertebral artery aneurysm. Its calcific nature is better appreciated on that study but overall the aneurysm does not appear to have significantly enlarged since that time. There is slight mass effect on the LEFT ventral medulla  No evidence for vestibular schwannoma or posterior fossa intra-axial mass. No parenchymal hemorrhage. No abnormal enhancement of the brain or meninges postcontrast. Incidental retrocerebellar cisterna magna. Normal pituitary and cerebellar tonsils. Flow voids are maintained in the other intracranial vessels. Extracranial soft tissues are unremarkable.  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 The aneurysm appears partially thrombosed. There is slight mass effect on the LEFT ventral medulla.  Formal catheter angiogram could be helpful in further evaluation.  These results will be called to the ordering clinician or representative by the Radiologist Assistant, and communication documented in the PACS or zVision Dashboard.   Electronically Signed   By: Rolla Flatten M.D.   On: 08/09/2014 19:35   Dg Shoulder Left  08/08/2014   CLINICAL DATA:  3-4 month history of left shoulder pain and stiffness without known injury  EXAM: LEFT SHOULDER - 2+ VIEW  COMPARISON:  None.  FINDINGS: The bones  of the shoulder are mildly osteopenic. There is no acute or healing fracture. The glenohumeral and AC joint spaces are preserved. There is no significant osteophyte formation. There is subjective mild narrowing of the subacromial subdeltoid space.  IMPRESSION: There is no acute or significant chronic bony abnormality of the left shoulder. Mild narrowing of the subacromial subdeltoid space may impact the rotator cuff.   Electronically Signed   By: David  [place]   On: 08/08/2014 10:58   Lorna Few, DO 08/10/2014, 1:36 PM PGY-1, [place] Intern pager: [phone], text pages welcome  Howard Pouch DO PGY3 [facility]  I have examined the patient today with the team. We have discussed the  assessment and plan. The intern's note above, has been altered to reflect the exam, assessment and plan. Howard Pouch, DO

## 2014-08-10 NOTE — Telephone Encounter (Signed)
Spoke with Latina Craver about MRI results.  After reviewing messages, I think the best thing to do would be to directly admit the patient to re-evaluate the patient, expedite neurology consultation, decide on anticoagulation if needed, and figure out the next steps.  Spoke with Dr. Dianah Field who ordered the MRI.  She and her clinic staff with contact patient and start the direct admission process, as well as alert the inpatient [facility]MT[facility] team.

## 2014-08-10 NOTE — Consult Note (Signed)
Reason for Consult:right vertebral artery aneurysm, left sided weakness Referring Physician: [place], Sara Haas is an 59 y.o. female.  HPI: She has complained of left shoulder pain and weakness since November, 2015. Her daughter notice a left facial droop approximately 3 weeks ago. 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 At that time the ct shows mild displacement of the medulla as it does currently. MRI shows no acute infarcts, no medullary edema, and no change in size of the aneurysm. With further questioning Sara Haas states she does not feel weak in her leg, just that she has pain at times. For example while I examined her she had no pain at all in the lower extremity.   Past Medical History  Diagnosis Date  . Thyroid disease     hypothyroidism  . Hyperlipidemia   . Asthma     Past Surgical History  Procedure Laterality Date  . Cystocele repair  2009  . Abdominal hysterectomy    . Bladder surgery    . Anterior and posterior repair N/A 05/18/2013    Procedure:  REPAIR OF RECTOCELE WITH GRAFT,REPAIR OF ENTEROCELE ;  Surgeon: Reece Packer, MD;  Location: [facility];  Service: Urology;  Laterality: N/A;  . Vaginal prolapse repair N/A 05/18/2013    Procedure:  VAULT PROLAPSE ;  Surgeon: Reece Packer, MD;  Location: [facility];  Service: Urology;  Laterality: N/A;  . Cystoscopy N/A 05/18/2013    Procedure: CYSTOSCOPY;  Surgeon: Reece Packer, MD;  Location: [facility];  Service: Urology;  Laterality: N/A;  . Thyroidectomy      Family History  Problem Relation Age of Onset  . Heart disease Mother   . Asthma Mother   . Stroke Father   . Heart disease Sister   . Heart disease Brother     Social History:  reports that she has never smoked. She has never used smokeless tobacco. She reports that she does not drink alcohol or use illicit drugs.  Allergies: No Known Allergies  Medications: I have  reviewed the patient's current medications.  Results for orders placed or performed during the hospital encounter of 08/10/14 (from the past 48 hour(s))  TSH     Status: None   Collection Time: 08/10/14  4:20 PM  Result Value Ref Range   TSH 0.472 0.350 - 4.500 uIU/mL  Comprehensive metabolic panel     Status: Abnormal   Collection Time: 08/10/14  4:21 PM  Result Value Ref Range   Sodium 141 135 - 145 mmol/L   Potassium 4.6 3.5 - 5.1 mmol/L   Chloride 106 96 - 112 mmol/L   CO2 26 19 - 32 mmol/L   Glucose, Bld 88 70 - 99 mg/dL   BUN 14 6 - 23 mg/dL   Creatinine, Ser 0.77 0.50 - 1.10 mg/dL   Calcium 9.0 8.4 - 10.5 mg/dL   Total Protein 6.9 6.0 - 8.3 g/dL   Albumin 3.2 (L) 3.5 - 5.2 g/dL   AST 21 0 - 37 U/L   ALT 15 0 - 35 U/L   Alkaline Phosphatase 82 39 - 117 U/L   Total Bilirubin 0.5 0.3 - 1.2 mg/dL   GFR calc non Af Amer 89 (L) >90 mL/min   GFR calc Af Amer >90 >90 mL/min    Comment: (NOTE) The eGFR has been calculated using the CKD EPI equation. This calculation has not been validated in all clinical situations.  eGFR's persistently <90 mL/min signify possible Chronic Kidney Disease.    Anion gap 9 5 - 15  CBC     Status: None   Collection Time: 08/10/14  4:21 PM  Result Value Ref Range   WBC 9.6 4.0 - 10.5 K/uL   RBC 4.42 3.87 - 5.11 MIL/uL   Hemoglobin 12.4 12.0 - 15.0 g/dL   HCT 38.2 36.0 - 46.0 %   MCV 86.4 78.0 - 100.0 fL   MCH 28.1 26.0 - 34.0 pg   MCHC 32.5 30.0 - 36.0 g/dL   RDW 13.3 11.5 - 15.5 %   Platelets 238 150 - 400 K/uL  Protime-INR     Status: None   Collection Time: 08/10/14  4:21 PM  Result Value Ref Range   Prothrombin Time 12.9 11.6 - 15.2 seconds   INR 0.96 0.00 - 1.49    Mr Jeri Cos Wo Contrast  08/09/2014   CLINICAL DATA:  LEFT facial pain, hearing loss, blurred vision and LEFT facial droop. Symptoms for 2 months. Evaluate for ischemic stroke or schwannoma/malignancy. Initial encounter.  EXAM: MRI HEAD WITHOUT AND WITH CONTRAST  TECHNIQUE:  Multiplanar, multiecho pulse sequences of the brain and surrounding structures were obtained without and with intravenous contrast.  CONTRAST:  11m MULTIHANCE GADOBENATE DIMEGLUMINE 529 MG/ML IV SOLN  COMPARISON:  CT of the cervical spine 09/10/2010.  FINDINGS: No evidence for acute infarction, hemorrhage, mass lesion, hydrocephalus, or extra-axial fluid. Normal cerebral volume. No significant white matter disease given the patient's chronologic age of 89  There is mass effect from an abnormal vascular structure near the LEFT foramen of Luschka. This structure is nearly spherical, measuring 13 x 14 x 15 mm contains variable enhancement, susceptibility indicating acute and chronic blood products, as well as central flow voids. It arises from the distal V4 segment LEFT vertebral artery adjacent to the LEFT PICA origin. Is unchanged from the previous CT of the cervical spine dated 09/10/2010 at which time it was described as likely representing a vertebral artery aneurysm. Its calcific nature is better appreciated on that study but overall the aneurysm does not appear to have significantly enlarged since that time. There is slight mass effect on the LEFT ventral medulla  No evidence for vestibular schwannoma or posterior fossa intra-axial mass. No parenchymal hemorrhage. No abnormal enhancement of the brain or meninges postcontrast. Incidental retrocerebellar cisterna magna. Normal pituitary and cerebellar tonsils. Flow voids are maintained in the other intracranial vessels. Extracranial soft tissues are unremarkable.  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 The aneurysm appears partially thrombosed. There is slight mass effect on the LEFT ventral medulla.  Formal catheter angiogram could be helpful in further evaluation.  These results will be called to the ordering clinician or representative by the Radiologist Assistant, and  communication documented in the PACS or zVision Dashboard.   Electronically Signed   By: JRolla FlattenM.D.   On: 08/09/2014 19:35   Mr Cervical Spine Wo Contrast  08/10/2014   CLINICAL DATA:  Left-sided weakness. Left facial drooping beginning 5 months ago with progressive left upper and lower extremity weakness. Left shoulder pain.  EXAM: MRI CERVICAL SPINE WITHOUT CONTRAST  TECHNIQUE: Multiplanar, multisequence MR imaging of the cervical spine was performed. No intravenous contrast was administered.  COMPARISON:  Cervical spine CT 09/10/2010.  Brain MRI 08/09/2014.  FINDINGS: Vertebral alignment is normal. Vertebral body heights and intervertebral disc space heights are preserved. No  vertebral marrow edema is seen. 1.5 cm distal left vertebral artery aneurysm is again seen as described on yesterday's brain MRI. 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 The cervical spinal cord is normal in caliber and signal. Small perineural cysts are incidentally noted in the right C6-7 and C7-T1 neural foramina.  C2-3:  Negative.  C3-4:  Negative.  C4-5:  Negative.  C5-6:  Minimal disc bulging without stenosis.  C6-7:  At most trace disc bulging without stenosis.  C7-T1:  Negative.  IMPRESSION: 1. Minimal cervical spondylosis without stenosis. 2. Distal left vertebral artery aneurysm as previously described.   Electronically Signed   By: Logan Bores   On: 08/10/2014 19:30    Review of Systems  Constitutional: Negative.   HENT: Negative.   Eyes: Negative.   Respiratory: Negative.   Cardiovascular: Negative.   Gastrointestinal: Negative.   Genitourinary: Negative.   Musculoskeletal: Positive for joint pain.       Left shoulder pain  Skin: Negative.   Neurological: Positive for focal weakness. Negative for weakness.       Feels left shoulder is weak Left facial droop x 3 weeks at least.  Endo/Heme/Allergies: Negative.   Psychiatric/Behavioral: Negative.    Blood pressure 116/69, pulse 66, temperature 98.4 F  (36.9 C), temperature source Oral, resp. rate 18, height 5' (1.524 m), weight 58 kg (127 lb 13.9 oz), SpO2 98 %. Physical Exam  Constitutional: She is oriented to person, place, and time. She appears well-developed and well-nourished. No distress.  HENT:  Head: Normocephalic and atraumatic.  Right Ear: External ear normal.  Left Ear: External ear normal.  Nose: Nose normal.  Mouth/Throat: Oropharynx is clear and moist. No oropharyngeal exudate.  Eyes: Conjunctivae and EOM are normal. Pupils are equal, round, and reactive to light. Right eye exhibits discharge. Left eye exhibits no discharge.  Neck: Normal range of motion. Neck supple.  Cardiovascular: Normal rate, regular rhythm and normal heart sounds.   Respiratory: Effort normal and breath sounds normal.  GI: Soft. Bowel sounds are normal.  Musculoskeletal:  Decreased abduction left shoulder  Neurological: She is alert and oriented to person, place, and time. She has normal strength and normal reflexes. A cranial nerve deficit is present. No sensory deficit. She exhibits normal muscle tone. She displays no Babinski's sign on the right side. She displays no Babinski's sign on the left side.  Mild left facial droop Did not assess gait Strength is normal in left upper extremity, pain limited abduction left shoulder. Sensation intact left upper extremity Normal strength in left lower extremity, normal hamstrings, quadriceps, hip flexors, hip extensors, plantar flexion, and dorsiflexion No hoffman's signs, toes downgoing to plantar stimulation, no clonus    Assessment/Plan: I do not believe the aneurysm has any role to play in her current state. I believe she has a primary left shoulder problem, characterized by her pain with passive abduction during her exam. I did not appreciate weakness in the extremities, except for the left shoulder.  The cervical spine MRI is normal, and the medullary displacement would not account for the symptoms she  complains of. There simply is no change in the aneurysm over at least 4 years. She did not know she had the aneurysm after the 2012 ct was performed clearly showing the aneurysm. Also the aneurysm arises from the right vertebral artery not the left. I will arrange for the angiogram to fully evaluate the aneurysm tomorrow.   Shristi Scheib L 08/10/2014, 8:24 PM

## 2014-08-11 ENCOUNTER — Inpatient Hospital Stay (HOSPITAL_COMMUNITY): Payer: MEDICAID

## 2014-08-11 LAB — CBC
HEMATOCRIT: 39.6 % (ref 36.0–46.0)
Hemoglobin: 12.8 g/dL (ref 12.0–15.0)
MCH: 28.1 pg (ref 26.0–34.0)
MCHC: 32.3 g/dL (ref 30.0–36.0)
MCV: 87 fL (ref 78.0–100.0)
Platelets: 218 10*3/uL (ref 150–400)
RBC: 4.55 MIL/uL (ref 3.87–5.11)
RDW: 13.5 % (ref 11.5–15.5)
WBC: 9.2 10*3/uL (ref 4.0–10.5)

## 2014-08-11 LAB — BASIC METABOLIC PANEL
ANION GAP: 8 (ref 5–15)
BUN: 13 mg/dL (ref 6–23)
CO2: 28 mmol/L (ref 19–32)
Calcium: 9.4 mg/dL (ref 8.4–10.5)
Chloride: 106 mmol/L (ref 96–112)
Creatinine, Ser: 0.61 mg/dL (ref 0.50–1.10)
GFR calc Af Amer: >90 mL/min (ref >90)
Glucose, Bld: 107 mg/dL — ABNORMAL HIGH (ref 70–99)
POTASSIUM: 4.7 mmol/L (ref 3.5–5.1)
SODIUM: 142 mmol/L (ref 135–145)

## 2014-08-11 MED ORDER — LIDOCAINE HCL 1 % IJ SOLN
INTRAMUSCULAR | Status: AC
  Filled 2014-08-11: qty 20

## 2014-08-11 MED ORDER — IOHEXOL 300 MG/ML  SOLN: 50.0000 mL | Freq: Once | INTRAMUSCULAR | Status: AC | PRN

## 2014-08-11 MED ORDER — ASPIRIN 81 MG PO CHEW
81.0000 mg | CHEWABLE_TABLET | Freq: Every day | ORAL | Status: DC
  Administered 2014-08-11 – 2014-08-12 (×2): 81 mg via ORAL
  Filled 2014-08-11 (×2): qty 1

## 2014-08-11 NOTE — Progress Notes (Signed)
Patient ID: Sara Haas, female   DOB: [birth year], 59 y.o.   MRN: [id number] BP 98/74 mmHg  Pulse 72  Temp(Src) 98.2 F (36.8 C) (Oral)  Resp 18  Ht 5' (1.524 m)  Wt 58 kg (127 lb 13.9 oz)  BMI 24.97 kg/m2  SpO2 96% Awaiting angio. No changes neurologically.

## 2014-08-11 NOTE — Progress Notes (Signed)
I spoke with the patient and her daughter this afternoon about the planned angiogram procedure. Unfortunately, an acute stroke has delayed the procedure and we have rescheduled for tomorrow afternoon. All the patient's questions were answered, and she is willing to proceed.

## 2014-08-11 NOTE — Progress Notes (Signed)
Family Medicine Teaching Service Daily Progress Note Intern Pager: [phone]  Patient name: Sara Haas Medical record number: [id number] Date of birth: 18-Jul-1955 Age: 59 y.o. Gender: female  Primary Care Provider: Annabell Sabal, MD Consultants: Neuro surgery Code Status: Full   Pt Overview and Major Events to Date:  4/21: Admitted for brain aneurysm   Assessment and Plan:  Sara Haas is a 59 y.o. 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 PMH is significant for hypothyroidism, asthma, HLD, mixed incontinence.   # Left Vertebral Artery Aneurysm: Noted to have left sided pain and weakness since November 2015. MRI on 4/19 showed chronically calcified and unchanged from 2012 distal left vertebral artery aneurysm 13x14x21mm that is partially thrombosed and causing slight mass effect on left ventral medulla. - PT/OT - Per neurosurgery- angiogram today, has been NPO in preparation  # Hyperlipidemia:  - Continue home pravastatin  # Asthma: Stable - Continue home albuterol  # Hypothyroidism: Stable - TSH .472  - Continue home synthroid 75 mcg  FEN/GI: NPO after midnight, Saline Lock Prophylaxis: SCD's  Disposition: Pending clinical improvement Subjective:  Feels well, denies headache, weakness  Objective: Temp:  [97.9 F (36.6 C)-98.4 F (36.9 C)] 98.2 F (36.8 C) (04/21 0533) Pulse Rate:  [66-83] 66 (04/21 0533) Resp:  [16-18] 18 (04/21 0533) BP: (104-130)/(63-76) 129/75 mmHg (04/21 0533) SpO2:  [96 %-100 %] 99 % (04/21 0533) Weight:  [127 lb 13.9 oz (58 kg)] 127 lb 13.9 oz (58 kg) (04/20 1721) Physical Exam: General: NAD HEENT: Moist mucous membranes, PERRLA, atraumatic Cardiovascular: S1 and S2 noted, no murmurs/rubs/gallops Respiratory: Clear  to auscultation bilaterally, no wheezes/rales/rhonci, no increased work of breathing Abdomen: soft and nondistended, bowel sounds noted, no tenderness or masses to palpation Extremities: No edema noted, SCDs in place Skin: No rashes noted, warm, dry Neuro: AAO, very mild facial droop noted on left  Laboratory:  Recent Labs Lab 08/10/14 1621 08/11/14 0623  WBC 9.6 9.2  HGB 12.4 12.8  HCT 38.2 39.6  PLT 238 218    Recent Labs Lab 08/09/14 1735 08/10/14 1621 08/11/14 0623  NA  --  141 142  K  --  4.6 4.7  CL  --  106 106  CO2  --  26 28  BUN  --  14 13  CREATININE 0.70 0.77 0.61  CALCIUM  --  9.0 9.4  PROT  --  6.9  --   BILITOT  --  0.5  --   ALKPHOS  --  82  --   ALT  --  15  --   AST  --  21  --   GLUCOSE  --  88 107*     Imaging/Diagnostic Tests: MRI spine: 4/20  FINDINGS: Vertebral alignment is normal. Vertebral body heights and intervertebral disc space heights are preserved. No vertebral marrow edema is seen. 1.5 cm distal left vertebral artery aneurysm is again seen as described on yesterday's brain MRI. 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 The cervical spinal cord is normal in caliber and signal. Small perineural cysts are incidentally noted in the right C6-7 and C7-T1 neural foramina.  C2-3: Negative.  C3-4: Negative.  C4-5: Negative.  C5-6: Minimal disc bulging without stenosis.  C6-7: At most trace disc bulging without stenosis.  C7-T1: Negative.  IMPRESSION: 1. Minimal cervical spondylosis  without stenosis. 2. Distal left vertebral artery aneurysm as previously described.  4/18 left shoulder XR  IMPRESSION: There is no acute or significant chronic bony abnormality of the left shoulder. Mild narrowing of the subacromial subdeltoid space may impact the rotator cuff.  [name], MD 08/11/2014, 8:16 AM PGY-1 [facility] Intern pager: [phone], text pages welcome

## 2014-08-11 NOTE — Evaluation (Signed)
Occupational Therapy Evaluation Patient Details Name: Sara Haas MRN: [id number] DOB: 1956/01/16 Today's Date: 08/11/2014    History of Present Illness This 59 y.o. female admitted with Lt sided facial pain, Lt arm pain and occasional wekness and blurred vision, as well as Lt LE pain.  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  Pt to undergo angogram 4/21 to further evaluation.  Neurosurgery consult placed who does not feel symptoms are related to aneurysm.    Clinical Impression   Pt admitted with above. She demonstrates the below listed deficits and will benefit from continued OT to maximize safety and independence with BADLs.  Pt with complaint of pain posterior shoulder that is constant and worse with shoulder extension and abduction.  Soft tissue mobilizations, myofascial release, and joint mobilizations performed with significant reduction in pain and improved ROM and function.   Pt reports pain as minimal to none.  If pt still in hospital tomorrow will follow up with her.  No follow up therapy indicated unless pain returns, if that is the case, she would benefit from OPPT.  Daughter instructed in how to obtain prescripton.         Follow Up Recommendations  Other (comment) (OPPT if pain returns )    Equipment Recommendations  None recommended by OT    Recommendations for Other Services       Precautions / Restrictions Precautions Precautions: None      Mobility Bed Mobility Overal bed mobility: Independent                Transfers Overall transfer level: Independent                    Balance                                            ADL Overall ADL's : At baseline                                       General ADL Comments: grossly assessed.  Pt reports she has been been able to perform ADLs mod I,  but has to modify how she does them due to Lt shoulder pain      Vision Additional Comments: Pt reports blurring of both eyes, Rt worse than left that has been present for several months.  She reports vision worsens as she tries to read small print and that she gets a headache when she attempts this.  She reports she has not seen her optometris/ophthalmologist in several years and her prescription may be outdated    Perception Perception Perception Tested?: Yes   Praxis      Pertinent Vitals/Pain Pain Assessment: Faces Faces Pain Scale: Hurts even more Pain Location: Lt shoulder and UE  Pain Descriptors / Indicators: Aching;Constant Pain Intervention(s): Monitored during session;Other (comment) (soft tissue mobilization and myofascial rlease )     Hand Dominance Right   Extremity/Trunk Assessment Upper Extremity Assessment Upper Extremity Assessment: LUE deficits/detail LUE Deficits / Details: Pt with complaint of pain posterior shoulder.  She has point tenderness  in the infraspinatus; serratus anterior, rhomboids, and subscapularis.   Pt with kyphosis with bil. scap abduction, but Lt  scap adducted compared to the Rt.   Pt with decreased ability to disassociate scapular and humeral movements.      Lower Extremity Assessment Lower Extremity Assessment: Defer to PT evaluation   Cervical / Trunk Assessment Cervical / Trunk Assessment: Kyphotic   Communication Communication Communication: Prefers language other than English   Cognition Arousal/Alertness: Awake/alert Behavior During Therapy: WFL for tasks assessed/performed Overall Cognitive Status: Within Functional Limits for tasks assessed                     General Comments       Exercises Exercises: Other exercises Other Exercises Other Exercises: soft tissu mobilization, myofascial release, and joint mobilzations performed Lt shoulder followed by AAROM and AROM facilitating good scapular/humeral rythm.  Pt  reports significant reduction in pain to (just a little) with all AROM.  Pt previously with reports signficant pain with shoulder extension and abduction.  At end of session, pt able to perform with minimal to no pain.  Pt taken for procedure.  Provided daughter with heat packs to apply to Lt shoulder, and instructed pt and daughter that if pain returns that pt would benefit from OPPT (instructed them how to obtain a prescription)   Shoulder Instructions      Home Living Family/patient expects to be discharged to:: Private residence Living Arrangements: Alone Available Help at Discharge: Family;Available PRN/intermittently Type of Home: House Home Access: Level entry           Bathroom Shower/Tub: Tub/shower unit Shower/tub characteristics: Architectural technologist: Standard                Prior Functioning/Environment Level of Independence: Independent        Comments: Pts daughter reports pt is very independent     OT Diagnosis: Acute pain   OT Problem List: Decreased strength;Pain;Impaired UE functional use   OT Treatment/Interventions: Therapeutic exercise;Neuromuscular education;Manual therapy;Patient/family education    OT Goals(Current goals can be found in the care plan section) Acute Rehab OT Goals Patient Stated Goal: to be able to use Lt Ue without pain  OT Goal Formulation: With patient/family Time For Goal Achievement: 08/18/14 Potential to Achieve Goals: Good ADL Goals Additional ADL Goal #1: Pt will demonstrate full AROM Lt UE with pain no greater than 2/10  OT Frequency: Min 2X/week   Barriers to D/C:            Co-evaluation              End of Session    Activity Tolerance: Patient tolerated treatment well Patient left: in bed;Other (comment);with family/visitor present (transporter in room )   Time: 1448-1856 OT Time Calculation (min): 53 min Charges:  OT General Charges $OT Visit: 1 Procedure OT Evaluation $Initial OT  Evaluation Tier I: 1 Procedure OT Treatments $Neuromuscular Re-education: 8-22 mins $Massage: 23-37 mins G-Codes:    Amye Grego M 2014/08/12, 4:19 PM

## 2014-08-11 NOTE — Evaluation (Signed)
Physical Therapy Evaluation Patient Details Name: Sara Haas MRN: [id number] DOB: 07/04/1955 Today's Date: 08/11/2014   History of Present Illness  This 59 y.o. female admitted with Lt sided facial pain, Lt arm pain and occasional wekness and blurred vision, as well as Lt LE pain.  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  Pt to undergo angogram 4/21 to further evaluation.  Neurosurgery consult placed who does not feel symptoms are related to aneurysm.   Clinical Impression  Patient independent with all aspects of mobility. Referred to OT for UE pain, no further acute PT needs at this time. Will sign off.    Follow Up Recommendations No PT follow up    Equipment Recommendations  None recommended by PT    Recommendations for Other Services       Precautions / Restrictions Precautions Precautions: None Restrictions Weight Bearing Restrictions: No      Mobility  Bed Mobility Overal bed mobility: Independent                Transfers Overall transfer level: Independent                  Ambulation/Gait Ambulation/Gait assistance: Independent Ambulation Distance (Feet): 260 Feet Assistive device: None Gait Pattern/deviations: WFL(Within Functional Limits)        Stairs            Wheelchair Mobility    Modified Rankin (Stroke Patients Only)       Balance Overall balance assessment: Independent                                           Pertinent Vitals/Pain Pain Assessment: Faces Faces Pain Scale: Hurts even more Pain Location: Lt shoulder and UE  Pain Descriptors / Indicators: Aching;Constant Pain Intervention(s): Monitored during session;Other (comment) (soft tissue mobilization and myofascial rlease )    Home Living Family/patient expects to be discharged to:: Private residence Living Arrangements:  Alone Available Help at Discharge: Family;Available PRN/intermittently Type of Home: House Home Access: Level entry     Home Layout: One level Home Equipment: None      Prior Function Level of Independence: Independent         Comments: Pts daughter reports pt is very independent      Hand Dominance   Dominant Hand: Right    Extremity/Trunk Assessment   Upper Extremity Assessment: LUE deficits/detail       LUE Deficits / Details: Pt with complaint of pain posterior shoulder.  She has point tenderness  in the infraspinatus; serratus anterior, rhomboids, and subscapularis.   Pt with kyphosis with bil. scap abduction, but Lt scap adducted compared to the Rt.   Pt with decreased ability to disassociate scapular and humeral movements.      Lower Extremity Assessment: Defer to PT evaluation      Cervical / Trunk Assessment: Kyphotic  Communication   Communication: Prefers language other than English  Cognition Arousal/Alertness: Awake/alert Behavior During Therapy: WFL for tasks assessed/performed Overall Cognitive Status: Within Functional Limits for tasks assessed                      General Comments General comments (skin integrity, edema, etc.): daughter present during eval and translated     Exercises  Assessment/Plan    PT Assessment Patent does not need any further PT services  PT Diagnosis Acute pain   PT Problem List    PT Treatment Interventions     PT Goals (Current goals can be found in the Care Plan section) Acute Rehab PT Goals Patient Stated Goal: to go home PT Goal Formulation: With patient    Frequency     Barriers to discharge        Co-evaluation               End of Session   Activity Tolerance: Patient tolerated treatment well Patient left: in bed;with call bell/phone within reach;with family/visitor present Nurse Communication: Mobility status         Time: 7622-6333 PT Time Calculation (min) (ACUTE  ONLY): 15 min   Charges:   PT Evaluation $Initial PT Evaluation Tier I: 1 Procedure     PT G CodesDuncan Dull Aug 29, 2014, 5:15 PM  Alben Deeds, [place] DPT  [phone]

## 2014-08-11 NOTE — Progress Notes (Signed)
I received a page from the patient's nurse stating that a surgeon was in the room and discussed with the patient her angiogram would be completed tomorrow. She was scheduled at 4 pm this evening, but had to be postponed secondary to an emergency surgery. I have placed a regular diet so she may eat dinner with this information. She is to be NPO at midnight tonight, in anticipation she will have her study completed in the morning.  Howard Pouch DO PGY3 [facility]

## 2014-08-11 NOTE — Progress Notes (Signed)
OT Cancellation Note  Patient Details Name: Sara Haas MRN: [id number] DOB: [birth year]   Cancelled Treatment:    Reason Eval/Treat Not Completed: Other (comment) (Pt with PT.) Pt being seen by PT. OT to reattempt as schedule permits.  Hortencia Pilar 08/11/2014, 2:08 PM

## 2014-08-12 ENCOUNTER — Ambulatory Visit: Payer: Self-pay | Admitting: Family Medicine

## 2014-08-12 ENCOUNTER — Inpatient Hospital Stay (HOSPITAL_COMMUNITY): Payer: Self-pay

## 2014-08-12 MED ORDER — FENTANYL CITRATE (PF) 100 MCG/2ML IJ SOLN
INTRAMUSCULAR | Status: AC
  Filled 2014-08-12: qty 2

## 2014-08-12 MED ORDER — HEPARIN SOD (PORK) LOCK FLUSH 100 UNIT/ML IV SOLN
INTRAVENOUS | Status: AC
  Filled 2014-08-12: qty 20

## 2014-08-12 MED ORDER — MIDAZOLAM HCL 2 MG/2ML IJ SOLN
INTRAMUSCULAR | Status: AC
Start: 2014-08-12 — End: 2014-08-13
  Filled 2014-08-12: qty 2

## 2014-08-12 MED ORDER — FENTANYL CITRATE (PF) 100 MCG/2ML IJ SOLN
INTRAMUSCULAR | Status: AC | PRN
  Administered 2014-08-12: 25 ug via INTRAVENOUS

## 2014-08-12 MED ORDER — LIDOCAINE HCL 1 % IJ SOLN
INTRAMUSCULAR | Status: AC
Start: 2014-08-12 — End: 2014-08-13
  Filled 2014-08-12: qty 20

## 2014-08-12 MED ORDER — MIDAZOLAM HCL 2 MG/2ML IJ SOLN
INTRAMUSCULAR | Status: AC | PRN
  Administered 2014-08-12: 0.5 mg via INTRAVENOUS

## 2014-08-12 MED ORDER — HEPARIN SOD (PORK) LOCK FLUSH 100 UNIT/ML IV SOLN
INTRAVENOUS | Status: AC | PRN
  Administered 2014-08-12: 20000 [IU] via INTRAVENOUS

## 2014-08-12 MED ORDER — ASPIRIN 81 MG PO CHEW
324.0000 mg | CHEWABLE_TABLET | Freq: Every day | ORAL | Status: DC
  Administered 2014-08-13 – 2014-08-15 (×3): 324 mg via ORAL
  Filled 2014-08-12 (×3): qty 4

## 2014-08-12 MED ORDER — IOHEXOL 300 MG/ML  SOLN
150.0000 mL | Freq: Once | INTRAMUSCULAR | Status: AC | PRN
  Administered 2014-08-12: 75 mL via INTRA_ARTERIAL

## 2014-08-12 NOTE — Sedation Documentation (Signed)
Patient denies pain and is resting comfortably.  

## 2014-08-12 NOTE — Progress Notes (Signed)
UR completed 

## 2014-08-12 NOTE — Progress Notes (Signed)
Family Medicine Teaching Service Daily Progress Note Intern Pager: [phone]  Patient name: Sara Haas Medical record number: [id number] Date of birth: June 28, 1955 Age: 59 y.o. Gender: female  Primary Care Provider: Annabell Sabal, MD Consultants: Neuro surgery Code Status: Full   Pt Overview and Major Events to Date:  4/21: Admitted for brain aneurysm   Assessment and Plan:  Sara Haas is a 59 y.o. 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 PMH is significant for hypothyroidism, asthma, HLD, mixed incontinence.   # Left Vertebral Artery Aneurysm: Noted to have left sided pain and weakness since November 2015. 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 - PT/OT - Per neurosurgery- angiogram delayed for today, has been NPO in preparation  # Hyperlipidemia:  - Continue home pravastatin  # Asthma: Stable - Continue home albuterol  # Hypothyroidism: Stable - TSH .472  - Continue home synthroid 75 mcg  FEN/GI: NPO after midnight, Saline Lock Prophylaxis: SCD's, aspirin  Disposition: Pending clinical improvement Subjective:  Feels well, denies headache, weakness  Objective: Temp:  [97.7 F (36.5 C)-98.8 F (37.1 C)] 98.8 F (37.1 C) (04/22 0509) Pulse Rate:  [63-88] 80 (04/22 0509) Resp:  [16-20] 18 (04/22 0509) BP: (98-125)/(65-75) 107/71 mmHg (04/22 0509) SpO2:  [94 %-99 %] 97 % (04/22 0509) Physical Exam: General: NAD, comfortable laying in bed HEENT: Moist mucous membranes, PERRLA, atraumatic Cardiovascular: S1 and S2 noted, no murmurs/rubs/gallops Respiratory: Clear to auscultation  bilaterally, no wheezes/rales/rhonci, no increased work of breathing Abdomen: soft and nondistended, bowel sounds noted, no tenderness or masses to palpation Extremities: No edema noted, SCDs not in place Skin: No rashes noted, warm, dry Neuro: AAO,   Laboratory:  Recent Labs Lab 08/10/14 1621 08/11/14 0623  WBC 9.6 9.2  HGB 12.4 12.8  HCT 38.2 39.6  PLT 238 218    Recent Labs Lab 08/09/14 1735 08/10/14 1621 08/11/14 0623  NA  --  141 142  K  --  4.6 4.7  CL  --  106 106  CO2  --  26 28  BUN  --  14 13  CREATININE 0.70 0.77 0.61  CALCIUM  --  9.0 9.4  PROT  --  6.9  --   BILITOT  --  0.5  --   ALKPHOS  --  82  --   ALT  --  15  --   AST  --  21  --   GLUCOSE  --  88 107*     Imaging/Diagnostic Tests: MRI spine: 4/20  FINDINGS: Vertebral alignment is normal. Vertebral body heights and intervertebral disc space heights are preserved. No vertebral marrow edema is seen. 1.5 cm distal left vertebral artery aneurysm is again seen as described on yesterday's brain MRI. 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 The cervical spinal cord is normal in caliber and signal. Small perineural cysts are incidentally noted in the right C6-7 and C7-T1 neural foramina.  C2-3: Negative.  C3-4: Negative.  C4-5: Negative.  C5-6: Minimal disc bulging without stenosis.  C6-7: At most trace disc bulging without stenosis.  C7-T1: Negative.  IMPRESSION: 1. Minimal cervical spondylosis without stenosis. 2. Distal left vertebral artery aneurysm as previously described.  4/18 left  shoulder XR  IMPRESSION: There is no acute or significant chronic bony abnormality of the left shoulder. Mild narrowing of the subacromial subdeltoid space may impact the rotator cuff.  [name], MD 08/12/2014, 9:02 AM PGY-1 [place] Intern pager: [phone], text pages welcome

## 2014-08-12 NOTE — Progress Notes (Signed)
Assessment concluded with daughter present at bedside. Consent for diagnostic cerebral angiogram signed with daughter.  Ave Filter, RN

## 2014-08-12 NOTE — Progress Notes (Signed)
Patient arrived back to room from IR. Advise pt tobeon bedrest for 3-4 hours. Family at bedside. She denied any pain or discomfort. Will continue to monitor.   Ave Filter, RN

## 2014-08-12 NOTE — Progress Notes (Signed)
OT Cancellation Note  Patient Details Name: Santiaga Butzin MRN: [id number] DOB: 10-07-1955   Cancelled Treatment:    Reason Eval/Treat Not Completed: Patient at procedure or test/ unavailable (Pt at endo)  Scott, OTR/L  [phone] 08/12/2014 08/12/2014, 3:41 PM

## 2014-08-13 DIAGNOSIS — R531 Weakness: Secondary | ICD-10-CM | POA: Insufficient documentation

## 2014-08-13 DIAGNOSIS — I720 Aneurysm of carotid artery: Secondary | ICD-10-CM

## 2014-08-13 DIAGNOSIS — M6289 Other specified disorders of muscle: Secondary | ICD-10-CM

## 2014-08-13 DIAGNOSIS — E785 Hyperlipidemia, unspecified: Secondary | ICD-10-CM

## 2014-08-13 DIAGNOSIS — E039 Hypothyroidism, unspecified: Secondary | ICD-10-CM | POA: Insufficient documentation

## 2014-08-13 MED ORDER — ALUM & MAG HYDROXIDE-SIMETH 200-200-20 MG/5ML PO SUSP
15.0000 mL | Freq: Once | ORAL | Status: AC
  Administered 2014-08-13: 15 mL via ORAL
  Filled 2014-08-13: qty 30

## 2014-08-13 NOTE — Progress Notes (Signed)
Family Medicine Teaching Service Daily Progress Note Intern Pager: [phone]  Patient name: Sara Haas Medical record number: [id number] Date of birth: 11-Oct-1955 Age: 59 y.o. Gender: female  Primary Care Provider: Annabell Sabal, MD Consultants: Neurosurgery Code Status: Full   Pt Overview and Major Events to Date:  4/21: Admitted for brain aneurysm  4/22: Angiogram brain done, results pending  Assessment and Plan:  Sara Haas is a 59 y.o. 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 PMH is significant for hypothyroidism, asthma, HLD, mixed incontinence.   # Left Vertebral Artery Aneurysm: Noted to have left sided pain and weakness since November 2015. MRI on 4/19 showed chronically calcified and unchanged from 2012 distal left vertebral artery aneurysm 13x14x51mm that is partially thrombosed and causing slight mass effect on left ventral medulla. - PT/OT - Angiogram done last night, awaiting read and neurosurgery recs appreciated  # Hyperlipidemia:  - Continue home pravastatin  # Asthma: Stable - Continue home albuterol  # Hypothyroidism: Stable - TSH 0.472  - Continue home synthroid 75 mcg  FEN/GI: Diet regular, Saline Lock Prophylaxis: SCD's, aspirin  Disposition: Pending further eval and recs by neurosurg  Subjective:  Feels well, denies headache or weakness. Still baseline left>right blurred vision.  Objective: Temp:  [97.8 F (36.6 C)-98.8 F (37.1 C)] 98.2 F (36.8 C) (04/23 0925) Pulse Rate:  [61-94] 94 (04/23 0925) Resp:  [14-20] 16 (04/23 0925) BP: (94-128)/(58-80) 112/58 mmHg (04/23 0925) SpO2:  [95 %-100 %] 99 % (04/23 0925) Physical Exam: General: NAD, comfortable laying in bed HEENT: Moist mucous membranes, PERRLA, atraumatic,  EOMI Cardiovascular: S1 and S2 noted, no murmurs/rubs/gallops, 1+ B radial and DP pulses Respiratory: Clear to auscultation bilaterally, no wheezes/rales/rhonci, no increased work of breathing, bibasilar fine crackles Abdomen: soft and nondistended, bowel sounds noted, no tenderness or masses to palpation Extremities: No edema noted, SCDs not in place Skin: No rashes noted, warm, dry Neuro: AAO, normal speech and good tone  Laboratory:  Recent Labs Lab 08/10/14 1621 08/11/14 0623  WBC 9.6 9.2  HGB 12.4 12.8  HCT 38.2 39.6  PLT 238 218    Recent Labs Lab 08/09/14 1735 08/10/14 1621 08/11/14 0623  NA  --  141 142  K  --  4.6 4.7  CL  --  106 106  CO2  --  26 28  BUN  --  14 13  CREATININE 0.70 0.77 0.61  CALCIUM  --  9.0 9.4  PROT  --  6.9  --   BILITOT  --  0.5  --   ALKPHOS  --  82  --   ALT  --  15  --   AST  --  21  --   GLUCOSE  --  88 107*     Imaging/Diagnostic Tests: MRI spine: 4/20  FINDINGS: Vertebral alignment is normal. Vertebral body heights and intervertebral disc space heights are preserved. No vertebral marrow edema is seen. 1.5 cm distal left vertebral artery aneurysm is again seen as described on yesterday's brain MRI. 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 The cervical spinal cord is normal in caliber and signal. Small perineural cysts are incidentally noted in the right C6-7 and C7-T1 neural foramina.  C2-3: Negative.  C3-4: Negative.  C4-5: Negative.  C5-6: Minimal disc bulging without stenosis.  C6-7: At most  trace disc bulging without stenosis.  C7-T1: Negative.  IMPRESSION: 1. Minimal cervical spondylosis without stenosis. 2. Distal left vertebral artery aneurysm as previously described.  4/18 left shoulder XR  IMPRESSION: There is no acute or significant chronic bony abnormality of the left shoulder. Mild narrowing of the subacromial subdeltoid space may impact the rotator cuff.  Hilton Sinclair,  MD 08/13/2014, 11:38 AM PGY-3 [place] Intern pager: [phone], text pages welcome

## 2014-08-13 NOTE — Progress Notes (Signed)
Patient ID: Sara Haas, female   DOB: 08/28/1955, 59 y.o.   MRN: [id number] Vital signs are stable Patient has been out of bed and ambulating Appears to have tolerated Angie a well Reports of Angio results not available at this time

## 2014-08-13 NOTE — Discharge Summary (Signed)
[facility] Discharge Summary  Patient name: Sara Haas Medical record number: [id number] Date of birth: 21-Feb-1956 Age: 59 y.o. Gender: female Date of Admission: 08/10/2014  Date of Discharge: 08/15/2014  Admitting Physician: Blane Ohara McDiarmid, MD  Primary Care Provider: Annabell Sabal, MD Consultants: Neurosurgery  Indication for Hospitalization: Vertebral artery aneurysm  Discharge Diagnoses/Problem List:  Left vertebral artery aneurysm Hyperlipidemia Asthma Hypothyroidism  Disposition: Home  Discharge Condition: Stable  Discharge Exam:  Objective: Temp: [98.2 F (36.8 C)-99.2 F (37.3 C)] 98.4 F (36.9 C) (04/25 0226) Pulse Rate: [76-112] 76 (04/25 0226) Resp: [18-20] 18 (04/25 0226) BP: (102-119)/(55-71) 102/55 mmHg (04/25 0226) SpO2: [93 %-97 %] 96 % (04/25 0226) Physical Exam: General: NAD, comfortable laying in bed HEENT: Moist mucous membranes, PERRLA, atraumatic, EOMI Cardiovascular: RRR, no m/r/g,  Respiratory: Clear to auscultation bilaterally, no wheezes/rales/rhonci, no increased work of breathing Abdomen: soft and nondistended, bowel sounds noted, no tenderness or masses to palpation Extremities: No edema noted or calf tenderness, SCDs not in place Skin: No rashes noted, warm, dry Neuro: AAO, normal speech and good tone  Brief Hospital Course:  This is a 59 y.o. female who had MRI brain 4/19 due to clinic visit with complaint of left sided facial pain with asymmetry, left arm pain with occasional weakness, left leg pain with weakness, and burred vision L>R since Nov 2015. 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 Neurosurgery was consulted and angiogram performed which showed a likely partially thrombosed, calcified distal right V4  aneurysm. It was also felt that the patient has severe proximal great vessel tortuosity which would make it very difficult to treat through an endovascular approach. Neurology will follow this aneurysm radiographically on an outpatient basis.  Chronic conditions of hyperlipidemia, asthma, and hypothyroidism were managed with home pravastatin, albuterol, and synthroid.   Issues for Follow Up:  1. Neurology to follow aneurysm  Significant Procedures:  - Angiogram: A likely partially thrombosed, calcified distal right V4 aneurysm. It was also felt that the patient has severe proximal great vessel tortuosity which would make it very difficult to treat through an endovascular approach  Significant Labs and Imaging:   Recent Labs Lab 08/10/14 1621 08/11/14 0623  WBC 9.6 9.2  HGB 12.4 12.8  HCT 38.2 39.6  PLT 238 218    Recent Labs Lab 08/09/14 1735  08/10/14 1621 08/11/14 0623 08/14/14 0808  NA  --   --  141 142 136  K  --   < > 4.6 4.7 4.5  CL  --   --  106 106 100  CO2  --   --  26 28 27   GLUCOSE  --   --  88 107* 135*  BUN  --   --  14 13 18   CREATININE 0.70  --  0.77 0.61 0.59  CALCIUM  --   --  9.0 9.4 9.3  ALKPHOS  --   --  82  --   --   AST  --   --  21  --   --   ALT  --   --  15  --   --   ALBUMIN  --   --  3.2*  --   --   < > = values in this interval not displayed.  MRI spine: 4/20  FINDINGS: Vertebral alignment is normal.  Vertebral body heights and intervertebral disc space heights are preserved. No vertebral marrow edema is seen. 1.5 cm distal left vertebral artery aneurysm is again seen as described on yesterday's brain MRI. 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 The cervical spinal cord is normal in caliber and signal. Small perineural cysts are incidentally noted in the right C6-7 and C7-T1 neural foramina.  C2-3: Negative.  C3-4: Negative.  C4-5: Negative.  C5-6: Minimal disc bulging without stenosis.  C6-7: At most trace disc bulging  without stenosis.  C7-T1: Negative.  IMPRESSION: 1. Minimal cervical spondylosis without stenosis. 2. Distal left vertebral artery aneurysm as previously described.  4/18 left shoulder XR  IMPRESSION: There is no acute or significant chronic bony abnormality of the left shoulder. Mild narrowing of the subacromial subdeltoid space may impact the rotator cuff.  Results/Tests Pending at Time of Discharge: None  Discharge Medications:    Medication List    TAKE these medications        albuterol 108 (90 BASE) MCG/ACT inhaler  Commonly known as:  PROVENTIL HFA;VENTOLIN HFA  Inhale 2 puffs into the lungs every 6 (six) hours as needed for wheezing or shortness of breath.     levothyroxine 75 MCG tablet  Commonly known as:  SYNTHROID, LEVOTHROID  Take 1 tablet (75 mcg total) by mouth daily.     pravastatin 20 MG tablet  Commonly known as:  PRAVACHOL  Take 1 tablet (20 mg total) by mouth at bedtime.        Discharge Instructions: Please refer to Patient Instructions section of EMR for full details.  Patient was counseled important signs and symptoms that should prompt return to medical care, changes in medications, dietary instructions, activity restrictions, and follow up appointments.   Follow-Up Appointments: Follow-up Information    Follow up with WALDEN,JEFF, MD. Schedule an appointment as soon as possible for a visit in 2 weeks.   Specialty:  Family Medicine   Contact information:   [place] [address] [phone]        [name], MD 08/15/2014, 11:01 AM PGY-1,  [place]

## 2014-08-14 LAB — BASIC METABOLIC PANEL
Anion gap: 9 (ref 5–15)
BUN: 18 mg/dL (ref 6–23)
CO2: 27 mmol/L (ref 19–32)
Calcium: 9.3 mg/dL (ref 8.4–10.5)
Chloride: 100 mmol/L (ref 96–112)
Creatinine, Ser: 0.59 mg/dL (ref 0.50–1.10)
GFR calc Af Amer: >90 mL/min (ref >90)
GFR calc non Af Amer: >90 mL/min (ref >90)
Glucose, Bld: 135 mg/dL — ABNORMAL HIGH (ref 70–99)
POTASSIUM: 4.5 mmol/L (ref 3.5–5.1)
SODIUM: 136 mmol/L (ref 135–145)

## 2014-08-14 MED ORDER — PRAVASTATIN SODIUM 20 MG PO TABS: 20.0000 mg | ORAL_TABLET | Freq: Every day | ORAL | Status: DC

## 2014-08-14 NOTE — Progress Notes (Signed)
Occupational Therapy Treatment Patient Details Name: Sara Haas MRN: [id number] DOB: 27-Sep-1955 Today's Date: 08/14/2014    History of present illness This 59 y.o. female admitted with Lt sided facial pain, Lt arm pain and occasional wekness and blurred vision, as well as Lt LE pain.  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  Pt to undergo angogram 4/21 to further evaluation.  Neurosurgery consult placed who does not feel symptoms are related to aneurysm.    OT comments  Pt reports visual changes as "blurred" vision. No c/o diplopia. Conjugate gaze. ? Minimal R Field deficit. Recommend follow up with her eye doctor to complete a visual assessment, including a full field assessment. Pt with  +impingement sign LUE and apparent trigger points L upper back with poor scapular gliding. Recommend pt follow up with the [facility] PT given her insurance status. Will continue to follow acutely.   Follow Up Recommendations  Supervision - Intermittent;Other (comment) (outpt PT)    Equipment Recommendations  None recommended by OT    Recommendations for Other Services      Precautions / Restrictions Precautions Precautions: None       Mobility Bed Mobility Overal bed mobility: Independent                Transfers Overall transfer level: Independent                    Balance Overall balance assessment: Independent                                 ADL Overall ADL's : At baseline                                              Vision Eye Alignment: Within Functional Limits Alignment/Gaze Preference: Within Defined Limits Ocular Range of Motion: Within Functional Limits Tracking/Visual Pursuits: Decreased smoothness of horizontal tracking Saccades: Decreased speed of saccadic movement Convergence: Within functional  limits Diplopia Assessment:  (no c/o diplpia)  Inconsistent with field assessments. Appears intact. Pt appears to have more difficulty seeing target in R upper field.                   Cognition   Behavior During Therapy: WFL for tasks assessed/performed Overall Cognitive Status: Within Functional Limits for tasks assessed                       Extremity/Trunk Assessment   c/o pain with movement over 90 degrees FF/Abd. Pain improves with distraction. ? subacromial impingement. Pt also c/o pain in upper trap. Apparent trigger points. Pt with abnormal scapular gliding L.             Exercises  Heat to L trap   Shoulder Instructions  discussed recommendation on therapy     General Comments  York Ram used to interpret    Pertinent Vitals/ Pain       Pain Assessment: Faces Faces Pain Scale: Hurts even more Pain Location: L shoulder with movement Pain Descriptors / Indicators: Grimacing;Jabbing Pain Intervention(s): Limited activity within patient's tolerance;Monitored during session;Repositioned;Heat applied  Home Living  Prior Functioning/Environment              Frequency Min 2X/week     Progress Toward Goals  OT Goals(current goals can now be found in the care plan section)  Progress towards OT goals: Progressing toward goals  Acute Rehab OT Goals Patient Stated Goal: to go home OT Goal Formulation: With patient/family Time For Goal Achievement: 08/18/14 Potential to Achieve Goals: Good ADL Goals Additional ADL Goal #1: Pt will demonstrate full AROM Lt UE with pain no greater than 2/10  Plan Discharge plan needs to be updated    Co-evaluation                 End of Session     Activity Tolerance Patient tolerated treatment well   Patient Left in bed;with call bell/phone within reach   Nurse Communication Mobility status;Other (comment) (D/C plan)        Time:  0258-5277 OT Time Calculation (min): 31 min  Charges: OT General Charges $OT Visit: 1 Procedure OT Treatments $Therapeutic Activity: 23-37 mins  Marwan Lipe,HILLARY 08/14/2014, 9:22 AM   [facility], OTR/L  [phone] 08/14/2014

## 2014-08-14 NOTE — Progress Notes (Signed)
Family Medicine Teaching Service Daily Progress Note Intern Pager: [phone]  Patient name: Sara Haas Medical record number: [id number] Date of birth: 03-14-1956 Age: 59 y.o. Gender: female  Primary Care Provider: Annabell Sabal, MD Consultants: Neurosurgery, IR (for angiogram) Code Status: Full   Pt Overview and Major Events to Date:  4/21: Admitted for brain aneurysm  4/22: Angiogram brain done, results pending read by IR  Assessment and Plan:  Sara Haas is a 59 y.o. 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 PMH is significant for hypothyroidism, asthma, HLD, mixed incontinence.   # Left Vertebral Artery Aneurysm: Noted to have left sided pain and weakness since November 2015. MRI on 4/19 showed chronically calcified and unchanged from 2012 distal left vertebral artery aneurysm 13x14x53mm that is partially thrombosed and causing slight mass effect on left ventral medulla. - PT/OT - Angiogram done 4/22 PM, awaiting read and neurosurgery recs appreciated. Spoke with radiology dept, will likely be Monday because has to be read by Dr Kathyrn Sheriff. Asked nurse to notify pt.  # Hyperlipidemia:  - Continue home pravastatin  # Asthma: Stable - Continue home albuterol  # Hypothyroidism: Stable - TSH 0.472  - Continue home synthroid 75 mcg  FEN/GI: Diet regular, Saline Lock Prophylaxis: SCD's (urged to wear), encouraged ambulation with aid if needed, aspirin  Disposition: Pending further eval and recs by neurosurg  Subjective:  Feels well still, denying any symptoms at this time other than stable left>right blurred vision.  Objective: Temp:  [98.2 F (36.8 C)-98.7 F (37.1 C)] 98.2 F (36.8 C) (04/24 0927) Pulse Rate:  [80-96] 80 (04/24 0927) Resp:   [16-18] 18 (04/24 0927) BP: (98-117)/(48-71) 115/71 mmHg (04/24 0927) SpO2:  [93 %-97 %] 97 % (04/24 0927) Physical Exam: General: NAD, comfortable laying in bed HEENT: Moist mucous membranes, PERRLA, atraumatic, EOMI Cardiovascular: RRR, no m/r/g, 1+ B radial and DP pulses Respiratory: Clear to auscultation bilaterally, no wheezes/rales/rhonci, no increased work of breathing Abdomen: soft and nondistended, bowel sounds noted, no tenderness or masses to palpation Extremities: No edema noted or calf tenderness, SCDs not in place Skin: No rashes noted, warm, dry Neuro: AAO, normal speech and good tone  Laboratory:  Recent Labs Lab 08/10/14 1621 08/11/14 0623  WBC 9.6 9.2  HGB 12.4 12.8  HCT 38.2 39.6  PLT 238 218    Recent Labs Lab 08/10/14 1621 08/11/14 0623 08/14/14 0808  NA 141 142 136  K 4.6 4.7 4.5  CL 106 106 100  CO2 26 28 27   BUN 14 13 18   CREATININE 0.77 0.61 0.59  CALCIUM 9.0 9.4 9.3  PROT 6.9  --   --   BILITOT 0.5  --   --   ALKPHOS 82  --   --   ALT 15  --   --   AST 21  --   --   GLUCOSE 88 107* 135*     Imaging/Diagnostic Tests: MRI spine: 4/20  FINDINGS: Vertebral alignment is normal. Vertebral body heights and intervertebral disc space heights are preserved. No vertebral marrow edema is seen. 1.5 cm distal left vertebral artery aneurysm is again seen as described on yesterday's brain MRI. 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 The cervical spinal cord is normal in caliber and signal. Small perineural cysts are incidentally noted in the right C6-7 and  C7-T1 neural foramina.  C2-3: Negative.  C3-4: Negative.  C4-5: Negative.  C5-6: Minimal disc bulging without stenosis.  C6-7: At most trace disc bulging without stenosis.  C7-T1: Negative.  IMPRESSION: 1. Minimal cervical spondylosis without stenosis. 2. Distal left vertebral artery aneurysm as previously described.  4/18 left shoulder XR  IMPRESSION: There is no  acute or significant chronic bony abnormality of the left shoulder. Mild narrowing of the subacromial subdeltoid space may impact the rotator cuff.  Hilton Sinclair, MD 08/14/2014, 9:41 AM PGY-3 [place] Intern pager: [phone], text pages welcome

## 2014-08-14 NOTE — Progress Notes (Signed)
Subjective: Patient reports no change  Objective: Vital signs in last 24 hours: Temp:  [98.2 F (36.8 C)-98.7 F (37.1 C)] 98.3 F (36.8 C) (04/24 0549) Pulse Rate:  [86-96] 88 (04/24 0549) Resp:  [16-18] 18 (04/24 0549) BP: (98-117)/(48-68) 104/58 mmHg (04/24 0549) SpO2:  [93 %-99 %] 95 % (04/24 0549)  Intake/Output from previous day:   Intake/Output this shift:    Physical Exam: Stable  Lab Results: No results for input(s): WBC, HGB, HCT, PLT in the last 72 hours. BMET No results for input(s): NA, K, CL, CO2, GLUCOSE, BUN, CREATININE, CALCIUM in the last 72 hours.  Studies/Results: No results found.  Assessment/Plan: Dr. Kathyrn Sheriff to address angiogram results and planning in AM.    LOS: 4 days    Verlena Marlette D, MD 08/14/2014, 8:06 AM

## 2014-08-14 NOTE — Discharge Instructions (Signed)
Ud estaba en hospital porque ha tenido aneurisma con coagulo pequeno en uno de los vasos sanguineos de su cerebro. El equipo medical de neurosirugenas han ordenado un estudio con mas detalles y eso ha [place] **.  You were in the hospital because you had an aneurysm with a small blood clot in one of the blood vessels of your brain. The neurosurgery team ordered a study with more details and this showed **.

## 2014-08-15 DIAGNOSIS — E02 Subclinical iodine-deficiency hypothyroidism: Secondary | ICD-10-CM

## 2014-08-15 MED ORDER — ASPIRIN 81 MG PO CHEW: 324.0000 mg | CHEWABLE_TABLET | Freq: Every day | ORAL | Status: AC

## 2014-08-15 NOTE — Op Note (Signed)
DIAGNOSTIC CEREBRAL ANGIOGRAM  OPERATOR: Dr. Consuella Lose, MD  HISTORY: The patient is a 59 year old woman who presented to the hospital with signs and symptoms of stroke.  Workup did include CT scan and MRI which demonstrated the presence of a vertebral artery aneurysm.  Diagnostic cerebral angiogram was therefore indicated.  APPROACH: The technical aspects of the procedure as well as its potential risks and benefits were reviewed with the patient and family. These risks included but were not limited bleeding, infection, allergic reaction, damage to organs/vital structures, stroke, non-diagnostic procedure, and the catastrophic outcomes of heart attack, coma, and death. With an understanding of these risks, informed consent was obtained and witnessed.  The patient was placed in the supine position on the angiography table and the skin of right groin prepped in the usual sterile fashion. The procedure was performed under local anesthesia (1%-solution of bicarbonate-buffered Lidocaine) and conscious sedation with Versed and fentanyl monitored by the in-suite nurse and myself.  A 5- French sheath was introduced in the right common femoral artery using Seldinger technique.A fluorophase sequence was used to document the sheath position.  HEPARIN: 2000Units total. CONTRAST AGENT: 100 cc, Omnipaque 300 FLUOROSCOPY TIME: 21.2 combined AP and lateral minutes  CATHETER(S) AND WIRE(S):  5-French JB-1 glidecatheter 5 French Simmons to glide catheter 5 [place] Cordis vertebral catheter 5-French pigtail catheter 0.035" glidewire  VESSELS CATHETERIZED: Right common carotid Left common carotid Innominate  Left vertebral Right common femoral  VESSELS STUDIED: Aortic arch: LAO Right common carotid: head: AP, lateral, obliques Right vertebral: AP, lateral and trans-facial Left common carotid: head: AP, lateral, obliques Left vertebral: AP, lateral  PROCEDURAL NARRATIVE: A 5-Fr pigtail  catheter was advanced over a glidewire into the aortic arch and an aortogram was performed. The pigtail was removed by pin pull technique over the wire. A 5-Fr JB-1 terumo glide catheter was then advanced over a 0.035 glidewire into the aortic arch and the innominate and right common carotid artery were selected. Cervical and cerebral angiography were performed.  Multiple attempts were made to catheterize the right subclavian and vertebral artery unsuccessfully.  The JB-1 catheter was then withdrawn into the aortic arch and the left subclavian artery was selected followed by the left vertebral artery. Cerebral angiography was performed.  The JB one catheter was then withdrawn into the arch, and the left common carotid artery was selected.  Cervical and cerebral angiogram was performed.  The JB-1 catheter was removed without incident.  The Cordis vertebral catheter was then introduced over the guidewire, and attempts were made to catheterize the right subclavian and vertebral artery unsuccessfully.  This catheter was removed, and the Simmons 2 glide catheter was introduced.  This was reformed, and multiple attempts were again made to catheterize the right subclavian artery unsuccessfully.  Finally, the right vertebral artery was studied from an innominate injection.  The Simmons catheter was then removed without incident.  INTERPRETATION: Aortic arch:  Wide, type I, normal three vessel arch configuration. No significant ostial stenosis.  There is significant tortuosity involving the proximal great vessels, especially the right brachiocephalic, proximal subclavian, and common carotid artery.  Right common carotid: head: Injection reveals the presence of a widely patent ICA, M1, and A1 segments and their branches.  No intracranial aneurysms, arteriovenous malformations or high flow fistulas are seen.  There does appear to be a small infundibulum at the origin of the right posterior communicating artery.   Parenchymal phase demonstrates no perfusion deficits.  The venous sinuses are widely patent.  Left  common carotid: head: Injection reveals the presence of a widely patent ICA, A1, and M1 segments and their branches.  No intracranial aneurysms, arteriovenous malformations or high flow fistulas are seen.  The parenchymal and venous phases are normal. The venous sinuses are widely patent.  Left vertebral: Injection reveals the presence of a widely patent vertebral artery. This leads to a widely patent basilar artery that terminates in bilateral P1. The basilar apex is normal.  No aneurysms, AVMs, or fistulas are seen.  The parenchymal and venous phases are normal. The venous sinuses are widely patent.  Right vertebral:  There is an aneurysm arising from the distal V4 segment of the right vertebral artery.  The angiographically visible portion measures 5.5 x 5.7 x 8.7 mm.  Subtracted images demonstrate the actual aneurysm to be somewhat larger, with a partially calcified dome, and is likely partially thrombosed.  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  DISPOSITION:  Upon completion of the study, the femoral sheath was removed and hemostasis obtained using a 5-Fr ExoSeal closure device. Good proximal and distal lower extremity pulses were documented upon achievement of hemostasis.  The procedure was well tolerated and no early complications were observed.   The patient was transferred back to the holding area, to be positioned flat in bed for 3 hours of observation.  IMPRESSION:  1.  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  Access to this aneurysm for any endovascular treatment is precluded due to severe proximal tortuosity.  The preliminary results of this procedure were shared with the patient and the patient's family.

## 2014-08-15 NOTE — Progress Notes (Signed)
Diagnostic angiogram was completed on Fri demonstrating a likely partially thrombosed, calcified distal right V4 aneurysm. The patient has severe proximal great vessel tortuosity which I believe would make it very difficult to treat through an endovascular approach. Open surgical treatment would also carry significant risk given the location and nature of the aneurysm. I have spoke to my partner, Dr. Christella Noa regarding the findings, and we would therefore follow this aneurysm radiographically on an outpatient basis.

## 2014-08-15 NOTE — Progress Notes (Signed)
Family Medicine Teaching Service Daily Progress Note Intern Pager: [phone]  Patient name: Sara Haas Medical record number: [id number] Date of birth: 04-03-1956 Age: 59 y.o. Gender: female  Primary Care Provider: Annabell Sabal, MD Consultants: Neurosurgery, IR (for angiogram) Code Status: Full   Pt Overview and Major Events to Date:  4/21: Admitted for brain aneurysm  4/22: Angiogram brain done, results pending read by IR  Assessment and Plan:  Sara Haas is a 59 y.o. 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 PMH is significant for hypothyroidism, asthma, HLD, mixed incontinence.   # Left Vertebral Artery Aneurysm: Noted to have left sided pain and weakness since November 2015. MRI on 4/19 showed chronically calcified and unchanged from 2012 distal left vertebral artery aneurysm 13x14x7mm that is partially thrombosed and causing slight mass effect on left ventral medulla. - PT/OT - Angiogram done 4/22 PM, awaiting read and neurosurgery recs appreciated. Again spoke with radiology regarding read, anticipate it to be done today.   # Hyperlipidemia:  - Continue home pravastatin  # Asthma: Stable - Continue home albuterol  # Hypothyroidism: Stable - TSH 0.472  - Continue home synthroid 75 mcg  FEN/GI: Diet regular, Saline Lock Prophylaxis: SCD's (urged to wear), encouraged ambulation with aid if needed, aspirin  Disposition: Pending further eval and recs by neurosurg  Subjective:  Feels well still, denying any symptoms at this time   Objective: Temp:  [98.2 F (36.8 C)-99.2 F (37.3 C)] 98.4 F (36.9 C) (04/25 0226) Pulse Rate:  [76-112] 76 (04/25 0226) Resp:  [18-20] 18 (04/25 0226) BP: (102-119)/(55-71) 102/55 mmHg (04/25 0226) SpO2:  [93  %-97 %] 96 % (04/25 0226) Physical Exam: General: NAD, comfortable laying in bed HEENT: Moist mucous membranes, PERRLA, atraumatic, EOMI Cardiovascular: RRR, no m/r/g,  Respiratory: Clear to auscultation bilaterally, no wheezes/rales/rhonci, no increased work of breathing Abdomen: soft and nondistended, bowel sounds noted, no tenderness or masses to palpation Extremities: No edema noted or calf tenderness, SCDs not in place Skin: No rashes noted, warm, dry Neuro: AAO, normal speech and good tone  Laboratory:  Recent Labs Lab 08/10/14 1621 08/11/14 0623  WBC 9.6 9.2  HGB 12.4 12.8  HCT 38.2 39.6  PLT 238 218    Recent Labs Lab 08/10/14 1621 08/11/14 0623 08/14/14 0808  NA 141 142 136  K 4.6 4.7 4.5  CL 106 106 100  CO2 26 28 27   BUN 14 13 18   CREATININE 0.77 0.61 0.59  CALCIUM 9.0 9.4 9.3  PROT 6.9  --   --   BILITOT 0.5  --   --   ALKPHOS 82  --   --   ALT 15  --   --   AST 21  --   --   GLUCOSE 88 107* 135*     Imaging/Diagnostic Tests: MRI spine: 4/20  FINDINGS: Vertebral alignment is normal. Vertebral body heights and intervertebral disc space heights are preserved. No vertebral marrow edema is seen. 1.5 cm distal left vertebral artery aneurysm is again seen as described on yesterday's brain MRI. 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 The cervical spinal cord is normal in caliber and signal. Small perineural cysts are incidentally noted in the right C6-7 and C7-T1 neural foramina.  C2-3: Negative.  C3-4: Negative.  C4-5: Negative.  C5-6: Minimal disc bulging without  stenosis.  C6-7: At most trace disc bulging without stenosis.  C7-T1: Negative.  IMPRESSION: 1. Minimal cervical spondylosis without stenosis. 2. Distal left vertebral artery aneurysm as previously described.  4/18 left shoulder XR  IMPRESSION: There is no acute or significant chronic bony abnormality of the left shoulder. Mild narrowing of the subacromial  subdeltoid space may impact the rotator cuff.  [name], MD 08/15/2014, 6:55 AM PGY-3 [place] Intern pager: [phone], text pages welcome

## 2014-08-15 NOTE — Progress Notes (Signed)
Patient is discharged from room 4N21 at this time. Alert and in stable condition. IV site d/c'd. Instructions read to patient and daughter and understanding verbalized. Left unit via wheelchair with all belongings at side.

## 2014-08-26 ENCOUNTER — Encounter: Payer: Self-pay | Admitting: Family Medicine

## 2014-08-26 ENCOUNTER — Ambulatory Visit (INDEPENDENT_AMBULATORY_CARE_PROVIDER_SITE_OTHER): Payer: Self-pay | Admitting: Family Medicine

## 2014-08-26 ENCOUNTER — Inpatient Hospital Stay: Payer: Self-pay | Admitting: Family Medicine

## 2014-08-26 VITALS — BP 126/80 | HR 66 | Temp 97.7°F | Ht 60.0 in | Wt 123.5 lb

## 2014-08-26 DIAGNOSIS — I720 Aneurysm of carotid artery: Secondary | ICD-10-CM

## 2014-08-26 DIAGNOSIS — M6289 Other specified disorders of muscle: Secondary | ICD-10-CM

## 2014-08-26 DIAGNOSIS — M25512 Pain in left shoulder: Secondary | ICD-10-CM

## 2014-08-26 DIAGNOSIS — I726 Aneurysm of vertebral artery: Secondary | ICD-10-CM

## 2014-08-26 DIAGNOSIS — R531 Weakness: Secondary | ICD-10-CM

## 2014-08-26 DIAGNOSIS — I729 Aneurysm of unspecified site: Secondary | ICD-10-CM

## 2014-08-26 NOTE — Assessment & Plan Note (Signed)
With known thrombus. No anticoagulation per Neurosurgery - inpatient.  Was supposed to have FU with neurosurgery on outpatient basis, but appointment not made.  Will refer today.   No further neurological issues about this.

## 2014-08-26 NOTE — Assessment & Plan Note (Signed)
Still with evidence of rotator cuff tendonopathy.  Refer for physical therapy today.  Did have some improvement s/p shoulder injection -- but now with pain again.  Otherwise doing well.

## 2014-08-26 NOTE — Progress Notes (Signed)
Subjective:    Sara Haas is a 59 y.o. female who presents to [facility] today for hospital FU:  1.  Hospital FU:  Recently admitted for facial asymmetry and weakness/blurred vision.  MRI ordered as outpatient, found to have vertebral artery aneurysm and thrombosis with slight mass effect.  Neurosurgery consulted and angiogram performed.  Torturous vasculature making endovascular approach to treatment difficulty.  Not a great open surgical candidate either.  Has FU with neurosurgery on outpatient basis already scheduled.   Since leaving the hospital, she has been doing well.  No further neurological issues.  ONly complaint now is Left shoulder pain.  Present for several weeks.  Can raise arm to about shoulder level, but no higher due to pain.  Difficulty sleeping on Left side due to pain. No trauma.  Gradually noticed pain when it occurred.  Not taking anything for pain relief.   Had physical therapy in the hospital, which helped with pain.    ROS as above per HPI, otherwise neg.    The following portions of the patient's history were reviewed and updated as appropriate: allergies, current medications, past medical history, family and social history, and problem list. Patient is a nonsmoker.    PMH reviewed.  Past Medical History  Diagnosis Date  . Thyroid disease     hypothyroidism  . Hyperlipidemia   . Asthma    Past Surgical History  Procedure Laterality Date  . Cystocele repair  2009  . Abdominal hysterectomy    . Bladder surgery    . Anterior and posterior repair N/A 05/18/2013    Procedure:  REPAIR OF RECTOCELE WITH GRAFT,REPAIR OF ENTEROCELE ;  Surgeon: Reece Packer, MD;  Location: [facility];  Service: Urology;  Laterality: N/A;  . Vaginal prolapse repair N/A 05/18/2013    Procedure:  VAULT PROLAPSE ;  Surgeon: Reece Packer, MD;  Location: [facility];  Service: Urology;  Laterality: N/A;  . Cystoscopy N/A 05/18/2013    Procedure: CYSTOSCOPY;  Surgeon: Reece Packer,  MD;  Location: [facility];  Service: Urology;  Laterality: N/A;  . Thyroidectomy      Medications reviewed. Current Outpatient Prescriptions  Medication Sig Dispense Refill  . albuterol (PROVENTIL HFA;VENTOLIN HFA) 108 (90 BASE) MCG/ACT inhaler Inhale 2 puffs into the lungs every 6 (six) hours as needed for wheezing or shortness of breath. 1 Inhaler 0  . aspirin 81 MG chewable tablet Chew 4 tablets (324 mg total) by mouth daily. 30 tablet 3  . levothyroxine (SYNTHROID, LEVOTHROID) 75 MCG tablet Take 1 tablet (75 mcg total) by mouth daily. 90 tablet 3  . pravastatin (PRAVACHOL) 20 MG tablet Take 1 tablet (20 mg total) by mouth at bedtime. 90 tablet 3   No current facility-administered medications for this visit.     Objective:   Physical Exam BP 126/80 mmHg  Pulse 66  Temp(Src) 97.7 F (36.5 C) (Oral)  Ht 5' (1.524 m)  Wt 123 lb 8 oz (56.019 kg)  BMI 24.12 kg/m2 Gen:  Alert, cooperative patient who appears stated age in no acute distress.  Vital signs reviewed. HEENT: EOMI,  MMM Cardiac:  Regular rate and rhythm  Pulm:  Clear to auscultation bilaterally with good air movement.  No wheezes or rales noted.   MSK:  Right shoulder WNL.  Left shoulder normal inspection.  Able to raise arm only to about 90 with active or passive ROM both lateral and forward raise.  Neer's negative.  Pain with internal rotation of the shoulder.  No results found for this or any previous visit (from the past 72 hour(s)).

## 2014-08-26 NOTE — Patient Instructions (Signed)
I have referred you to both physical therapy and neurosurgery.  Both of their offices will contact you.  Keep taking the aspirin.    Take the pain relievers before you go to physical therapy.  Come back and see me in 3 months or sooner if you have issues.

## 2014-08-26 NOTE — Assessment & Plan Note (Signed)
Much improved.   Bell's palsy a possibility based on looking at prior notes.

## 2014-09-13 ENCOUNTER — Ambulatory Visit: Payer: No Typology Code available for payment source | Attending: Family Medicine | Admitting: Physical Therapy

## 2014-09-13 DIAGNOSIS — R293 Abnormal posture: Secondary | ICD-10-CM | POA: Insufficient documentation

## 2014-09-13 DIAGNOSIS — R29898 Other symptoms and signs involving the musculoskeletal system: Secondary | ICD-10-CM | POA: Insufficient documentation

## 2014-09-13 DIAGNOSIS — M25512 Pain in left shoulder: Secondary | ICD-10-CM | POA: Insufficient documentation

## 2014-09-13 DIAGNOSIS — M25612 Stiffness of left shoulder, not elsewhere classified: Secondary | ICD-10-CM

## 2014-09-13 NOTE — Therapy (Signed)
[place], [place], [address] Phone: [phone]   Fax:  [phone]  Physical Therapy Evaluation  Patient Details  Name: Cynia Abruzzo MRN: [id number] Date of Birth: 02-Feb-1956 Referring Provider:  Alveda Reasons, MD  Encounter Date: 09/13/2014      PT End of Session - 09/13/14 1356    Visit Number 1   Number of Visits 12   Date for PT Re-Evaluation 10/25/14   PT Start Time 1100   PT Stop Time 1145   PT Time Calculation (min) 45 min   Activity Tolerance Patient tolerated treatment well   Behavior During Therapy [facility] for tasks assessed/performed      Past Medical History  Diagnosis Date  . Thyroid disease     hypothyroidism  . Hyperlipidemia   . Asthma     Past Surgical History  Procedure Laterality Date  . Cystocele repair  2009  . Abdominal hysterectomy    . Bladder surgery    . Anterior and posterior repair N/A 05/18/2013    Procedure:  REPAIR OF RECTOCELE WITH GRAFT,REPAIR OF ENTEROCELE ;  Surgeon: Reece Packer, MD;  Location: [facility];  Service: Urology;  Laterality: N/A;  . Vaginal prolapse repair N/A 05/18/2013    Procedure:  VAULT PROLAPSE ;  Surgeon: Reece Packer, MD;  Location: [facility];  Service: Urology;  Laterality: N/A;  . Cystoscopy N/A 05/18/2013    Procedure: CYSTOSCOPY;  Surgeon: Reece Packer, MD;  Location: [facility];  Service: Urology;  Laterality: N/A;  . Thyroidectomy      There were no vitals filed for this visit.  Visit Diagnosis:  Left shoulder pain - Plan: PT plan of care cert/re-cert  Weakness of left arm - Plan: PT plan of care cert/re-cert  Decreased ROM of left shoulder - Plan: PT plan of care cert/re-cert  Abnormal posture - Plan: PT plan of care cert/re-cert      Subjective Assessment - 09/13/14 1107    Subjective pt is a 59 y.o F with CC of L shoulder pain in november that started insidiously. She was hospitalized for a brain  anuyurism about 3 weeks ago. the symptoms have gotten a little better since then.    Patient is accompained by: Interpreter;Family member  daughter   Limitations Lifting;House hold activities   How long can you sit comfortably? unlimited   How long can you stand comfortably? unlimited   How long can you walk comfortably? unlimited   Diagnostic tests 08/08/2014 Mild narrowing of the subacromial subdeltoid space   Patient Stated Goals to be pain free   Currently in Pain? Yes   Pain Score 5    Pain Location Shoulder   Pain Orientation Left   Pain Descriptors / Indicators Aching;Tightness;Throbbing   Pain Radiating Towards shoulder, and back   Pain Onset More than a month ago   Pain Frequency Constant   Aggravating Factors  lifting, carrying, movement   Pain Relieving Factors motrin            [facility] PT Assessment - 09/13/14 1114    Assessment   Medical Diagnosis left shoulder pain   Onset Date/Surgical Date --  november 2015   Hand Dominance Right   Next MD Visit --  2 months    Prior Therapy no   Precautions   Precautions None   Restrictions   Weight Bearing Restrictions No   Balance Screen   Has the patient fallen in the past 6  months No   Has the patient had a decrease in activity level because of a fear of falling?  No   Is the patient reluctant to leave their home because of a fear of falling?  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   Leisure gardening, flowers, vegetables   Cognition   Overall Cognitive Status Within Functional Limits for tasks assessed   Observation/Other Assessments   Focus on Therapeutic Outcomes (FOTO)  70%  limitation   predicted 41%   Posture/Postural Control   Posture/Postural Control Postural limitations   Postural Limitations Rounded Shoulders;Forward head   ROM / Strength   AROM / PROM / Strength AROM;PROM;Strength   AROM   AROM Assessment Site Shoulder   Right/Left Shoulder Right;Left   Right Shoulder Extension 64 Degrees   Right Shoulder Flexion 178 Degrees   Right Shoulder ABduction 162 Degrees   Right Shoulder Internal Rotation 48 Degrees   Right Shoulder External Rotation 70 Degrees   Left Shoulder Extension 54 Degrees   Left Shoulder Flexion 110 Degrees   Left Shoulder ABduction 78 Degrees  pain during movement   Left Shoulder Internal Rotation 48 Degrees   Left Shoulder External Rotation 30 Degrees   PROM   PROM Assessment Site Shoulder   Right/Left Shoulder Right;Left   Left Shoulder Flexion 120 Degrees   Left Shoulder ABduction 98 Degrees   Left Shoulder Internal Rotation 50 Degrees   Left Shoulder External Rotation 40 Degrees   Strength   Strength Assessment Site Shoulder   Right/Left Shoulder Right;Left   Right Shoulder Flexion 3+/5   Right Shoulder Extension 3+/5   Right Shoulder ABduction 3+/5   Right Shoulder Internal Rotation 3+/5   Right Shoulder External Rotation 3+/5   Left Shoulder Extension 4+/5   Left Shoulder ABduction 4+/5   Left Shoulder Internal Rotation 4+/5   Left Shoulder External Rotation 4+/5   Palpation   Palpation comment tenderness located at the muscle belly of the supraspinatus with referal to the deltoid tuberosity, spasm palpable with bil paraspinals. Postive special testing of hawkins kennedy, neers, open/empty can, and scapular assist                   [facility] Adult PT Treatment/Exercise - 09/13/14 1114    Shoulder Exercises: Standing   External Rotation AROM;Strengthening;Left;10 reps   Internal Rotation AROM;Strengthening;Left;10 reps   Other Standing Exercises pendulums x 2 min  to decrease pain/ tightness    Shoulder Exercises: ROM/Strengthening   Other ROM/Strengthening Exercises wand flexion and abduction 2 x 10                PT Education - 09/13/14 1355    Education provided Yes   Education Details evaluation findings, POC, goals, HEP anatomical explanaiton   Person(s) Educated Patient;Child(ren)  daughter   Methods Explanation   Comprehension Verbalized understanding;Returned demonstration          PT Short Term Goals - 09/13/14 1401    PT SHORT TERM GOAL #1   Title she will be I with basic HEP (10/04/2014)   Time 3   Period Weeks   Status New   PT SHORT TERM  GOAL #2   Title she will increase FOTO score by > 10 points to assist with funcitonal capacity (10/04/2014)   Time 3   Period Weeks   Status New   PT SHORT TERM GOAL #3   Title she will be able to verbalize and demonstrate techniques to reduce inflammation via RICE method (10/04/2014)   Time 3   Period Weeks   Status New           PT Long Term Goals - 09/13/14 1403    PT LONG TERM GOAL #1   Title pt will be I with advanced HEP (10/25/2014)   Time 6   Period Weeks   Status New   PT LONG TERM GOAL #2   Title pt will increase L shoulder AROM by > 20 degrees in all planes to assist with ADLs (10/25/2014)   Time 6   Period Weeks   Status New   PT LONG TERM GOAL #3   Title She will Increase L shoulder strength to > 4/5 to assist wtih her goal of getting back to gardening (10/25/2014)   Time 6   Period Weeks   Status New   PT LONG TERM GOAL #4   Title pt will decrease pain to < 3/10 during and following lifting > 5# above her head to assist with cooking and other ADLs (10/25/2014)   Time 6   Period Weeks   Status New   PT LONG TERM GOAL #5   Title pt will increase FOTO score to > 59 to assist with functional capacity upon discharge (10/25/2014)   Time 6   Period Weeks   Status New               Plan - 09/13/14 1357    Clinical Impression Statement Elizabella presents to OPPT with CC of L shoulder  pain. She demonstrates limited AROM/PROM of the L shoulder secondary to pain and tightness. MMT exhibits weakness of the L shoulder strength rated at a 3+/5 with all muscles due to pain. Special testing was postivie for [employer], neers, open/empty can, and scapular assist with tenderness of the suparspinatus indicates possible impigntement pathology. She demonstrates mild/moderate scapular dyskineisa/ winging during postural assessment. She would benefit from skilled physical therapy to maximize her funciton by addressing the impairments listed.    Pt will benefit from skilled therapeutic intervention in order to improve on the following deficits Pain;Postural dysfunction;Impaired flexibility;Impaired UE functional use;Improper body mechanics;Decreased activity tolerance;Decreased endurance;Decreased range of motion;Decreased strength;Hypomobility   Rehab Potential Good   PT Frequency 2x / week   PT Duration 6 weeks   PT Treatment/Interventions ADLs/Self Care Home Management;Cryotherapy;Electrical Stimulation;Iontophoresis 4mg /ml Dexamethasone;Moist Heat;Ultrasound;Therapeutic activities;Therapeutic exercise;Neuromuscular re-education;Patient/family education;Manual techniques;Passive range of motion;Dry needling;Taping   PT Next Visit Plan assess response to HEP, modalities for pain, shoulder/ scapular strengthening, manual, modalities for pain   PT Home Exercise Plan see HEP handout   Consulted and Agree with Plan of Care Patient;Family member/caregiver;Other (Comment)   Family Member Consulted daughter and interpreter         Problem List Patient Active Problem List   Diagnosis Date Noted  . Left-sided weakness   . 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 Facial droop 07/23/2014  . Left leg pain 07/23/2014  . Left shoulder pain 07/23/2014  . Preventative health care 01/14/2014  . Solitary pulmonary nodule 07/20/2013  . Rectocele 05/18/2013  . Abdominal hernia 12/18/2012  . Female  bladder prolapse, acquired 10/21/2012  . Mixed incontinence 10/21/2012  .  HLD (hyperlipidemia) 03/26/2012  . Asthma 03/25/2012  . MENOPAUSE, SURGICAL 12/10/2006  . HYPOTHYROIDISM 12/09/2006   Starr Lake PT, DPT, LAT, ATC  09/13/2014  2:12 PM   [place] [facility] [address] [place], [place], [address] Phone: [phone]   Fax:  [phone]

## 2014-09-13 NOTE — Patient Instructions (Addendum)
   Araf Clugston PT, DPT, LAT, ATC  [facility] Phone: [phone]     

## 2014-09-28 ENCOUNTER — Ambulatory Visit: Payer: No Typology Code available for payment source | Attending: Family Medicine | Admitting: Physical Therapy

## 2014-09-28 DIAGNOSIS — R293 Abnormal posture: Secondary | ICD-10-CM

## 2014-09-28 DIAGNOSIS — M25612 Stiffness of left shoulder, not elsewhere classified: Secondary | ICD-10-CM

## 2014-09-28 DIAGNOSIS — M25512 Pain in left shoulder: Secondary | ICD-10-CM | POA: Insufficient documentation

## 2014-09-28 DIAGNOSIS — M7582 Other shoulder lesions, left shoulder: Secondary | ICD-10-CM | POA: Insufficient documentation

## 2014-09-28 DIAGNOSIS — R29898 Other symptoms and signs involving the musculoskeletal system: Secondary | ICD-10-CM | POA: Insufficient documentation

## 2014-09-28 NOTE — Patient Instructions (Signed)
EXTENSION: Standing - Resistance Band: Stable (Active)   Stand,  arms at side. Against red resistance band, draw arms backward, just to hips, keeping elbow straight. Complete __2_ sets of _10__ repetitions. Perform _2__ sessions per day.  Resistive Band Rowing   Coloque una banda elstica en la puerta, tome ambos extremos. Doble los codos y tire hacia atrs juntando las escpulas. Mantenga __5__ segundos. Repita __10-20__ veces. Haga _2___ sesiones por da.  http://[email or web address].exer.us/98  Cane Exercise: Flexion   [place] sobre la espalda sosteniendo el bastn arriba de la cara. [place] lo ms estirados que pueda. Baje el bastn hacia el suelo por encima de la cabeza. Intente mantener los codos extendidos. Mantenga __10__ segundos. Repita __10__ veces. Haga _2___ sesiones por da.  http://[email or web address].exer.us/92   Copyright  VHI. All rights reserved.

## 2014-09-28 NOTE — Therapy (Signed)
[place], [place], [address] Phone: [phone]   Fax:  [phone]  Physical Therapy Treatment  Patient Details  Name: Sara Haas MRN: [id number] Date of Birth: 03/22/1956 Referring Provider:  Alveda Reasons, MD  Encounter Date: 09/28/2014      PT End of Session - 09/28/14 0934    Visit Number 2   Number of Visits 12   Date for PT Re-Evaluation 10/25/14   PT Start Time 0933   PT Stop Time 1017   PT Time Calculation (min) 44 min      Past Medical History  Diagnosis Date  . Thyroid disease     hypothyroidism  . Hyperlipidemia   . Asthma     Past Surgical History  Procedure Laterality Date  . Cystocele repair  2009  . Abdominal hysterectomy    . Bladder surgery    . Anterior and posterior repair N/A 05/18/2013    Procedure:  REPAIR OF RECTOCELE WITH GRAFT,REPAIR OF ENTEROCELE ;  Surgeon: Reece Packer, MD;  [facility]: [facility];  Service: Urology;  Laterality: N/A;  . Vaginal prolapse repair N/A 05/18/2013    Procedure:  VAULT PROLAPSE ;  Surgeon: Reece Packer, MD;  Location: [facility];  Service: Urology;  Laterality: N/A;  . Cystoscopy N/A 05/18/2013    Procedure: CYSTOSCOPY;  Surgeon: Reece Packer, MD;  Location: [facility];  Service: Urology;  Laterality: N/A;  . Thyroidectomy      There were no vitals filed for this visit.  Visit Diagnosis:  Left shoulder pain  Weakness of left arm  Decreased ROM of left shoulder  Abnormal posture      Subjective Assessment - 09/28/14 0934    Subjective If eel more pain lefting forward and back with my arm.    Currently in Pain? Yes   Pain Score 5    Pain Location Shoulder   Pain Orientation Left   Aggravating Factors  movement   Pain Relieving Factors rest            [facility] PT Assessment - 09/28/14 0937    AROM   Left Shoulder Flexion 123 Degrees   Left Shoulder ABduction 104 Degrees   Left Shoulder Internal Rotation  60 Degrees  middle of sacrum    Left Shoulder External Rotation 62 Degrees  reach to upper trap                     [facility] Adult PT Treatment/Exercise - 09/28/14 0952    Shoulder Exercises: Standing   External Rotation Left;15 reps;Theraband   Theraband Level (Shoulder External Rotation) Level 2 (Red)   Internal Rotation Left;15 reps;Theraband   Theraband Level (Shoulder Internal Rotation) Level 2 (Red)   Flexion Left;15 reps;Theraband   Theraband Level (Shoulder Flexion) Level 2 (Red)   Flexion Limitations punch   Extension Both;15 reps;Theraband   Theraband Level (Shoulder Extension) Level 2 (Red)   Row Both;15 reps;Theraband   Theraband Level (Shoulder Row) Level 2 (Red)   Shoulder Exercises: Pulleys   Flexion 2 minutes   Shoulder Exercises: ROM/Strengthening   Other ROM/Strengthening Exercises supine cane pullovers x 10 and press ups x 10,   horiz abdct/adduct x10   Other ROM/Strengthening Exercises wall ladder x 2 flexion- painful   Modalities   Modalities Moist Heat   Moist Heat Therapy   Number Minutes Moist Heat 15 Minutes   Moist Heat Location Shoulder  left supine  PT Education - 09/28/14 1013    Education provided Yes   Education Details supine pullovers, rows and extension with red band   Person(s) Educated Patient   Methods Explanation;Handout   Comprehension Verbalized understanding          PT Short Term Goals - 09/13/14 1401    PT SHORT TERM GOAL #1   Title she will be I with basic HEP (10/04/2014)   Time 3   Period Weeks   Status New   PT SHORT TERM GOAL #2   Title she will increase FOTO score by > 10 points to assist with funcitonal capacity (10/04/2014)   Time 3   Period Weeks   Status New   PT SHORT TERM GOAL #3   Title she will be able to verbalize and demonstrate techniques to reduce inflammation via RICE method (10/04/2014)   Time 3   Period Weeks   Status New           PT Long Term Goals -  09/13/14 1403    PT LONG TERM GOAL #1   Title pt will be I with advanced HEP (10/25/2014)   Time 6   Period Weeks   Status New   PT LONG TERM GOAL #2   Title pt will increase L shoulder AROM by > 20 degrees in all planes to assist with ADLs (10/25/2014)   Time 6   Period Weeks   Status New   PT LONG TERM GOAL #3   Title She will Increase L shoulder strength to > 4/5 to assist wtih her goal of getting back to gardening (10/25/2014)   Time 6   Period Weeks   Status New   PT LONG TERM GOAL #4   Title pt will decrease pain to < 3/10 during and following lifting > 5# above her head to assist with cooking and other ADLs (10/25/2014)   Time 6   Period Weeks   Status New   PT LONG TERM GOAL #5   Title pt will increase FOTO score to > 59 to assist with functional capacity upon discharge (10/25/2014)   Time 6   Period Weeks   Status New               Plan - 09/28/14 1017    Clinical Impression Statement Pt presents with increased ROM and good toeralance with only minimal increase in pain. Trial of HMP for pain. Pt reports decreased pain.    PT Next Visit Plan assess response to HEP, modalities for pain, shoulder/ scapular strengthening, manual, modalities for pain        Problem List Patient Active Problem List   Diagnosis Date Noted  . Left-sided weakness   . 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 Facial droop 07/23/2014  . Left leg pain 07/23/2014  . Left shoulder pain 07/23/2014  . Preventative health care 01/14/2014  . Solitary pulmonary nodule 07/20/2013  . Rectocele 05/18/2013  . Abdominal hernia 12/18/2012  . Female bladder prolapse, acquired 10/21/2012  . Mixed incontinence 10/21/2012  . HLD (hyperlipidemia) 03/26/2012  . Asthma 03/25/2012  . MENOPAUSE, SURGICAL 12/10/2006  . HYPOTHYROIDISM 12/09/2006    Dorene Ar, PTA 09/28/2014, 10:18 AM  [facility] [address] [place], [place], [address] Phone:  [phone]   Fax:  [phone]

## 2014-09-30 ENCOUNTER — Ambulatory Visit: Payer: No Typology Code available for payment source | Admitting: Physical Therapy

## 2014-09-30 DIAGNOSIS — M25612 Stiffness of left shoulder, not elsewhere classified: Secondary | ICD-10-CM

## 2014-09-30 DIAGNOSIS — R29898 Other symptoms and signs involving the musculoskeletal system: Secondary | ICD-10-CM

## 2014-09-30 DIAGNOSIS — R293 Abnormal posture: Secondary | ICD-10-CM

## 2014-09-30 DIAGNOSIS — M25512 Pain in left shoulder: Secondary | ICD-10-CM

## 2014-09-30 NOTE — Therapy (Signed)
[place], [place], [address] Phone: [phone]   Fax:  [phone]  Physical Therapy Treatment  Patient Details  Name: Sara Haas MRN: [id number] Date of Birth: 04-30-1955 Referring Provider:  Alveda Reasons, MD  Encounter Date: 09/30/2014      PT End of Session - 09/30/14 1004    Visit Number 3   Number of Visits 12   Date for PT Re-Evaluation 10/25/14   PT Start Time 0930   PT Stop Time 1025   PT Time Calculation (min) 55 min      Past Medical History  Diagnosis Date  . Thyroid disease     hypothyroidism  . Hyperlipidemia   . Asthma     Past Surgical History  Procedure Laterality Date  . Cystocele repair  2009  . Abdominal hysterectomy    . Bladder surgery    . Anterior and posterior repair N/A 05/18/2013    Procedure:  REPAIR OF RECTOCELE WITH GRAFT,REPAIR OF ENTEROCELE ;  Surgeon: Reece Packer, MD;  Location: [facility];  Service: Urology;  Laterality: N/A;  . Vaginal prolapse repair N/A 05/18/2013    Procedure:  VAULT PROLAPSE ;  Surgeon: Reece Packer, MD;  Location: [facility];  Service: Urology;  Laterality: N/A;  . Cystoscopy N/A 05/18/2013    Procedure: CYSTOSCOPY;  Surgeon: Reece Packer, MD;  Location: [facility];  Service: Urology;  Laterality: N/A;  . Thyroidectomy      There were no vitals filed for this visit.  Visit Diagnosis:  Left shoulder pain  Weakness of left arm  Decreased ROM of left shoulder  Abnormal posture      Subjective Assessment - 09/30/14 1003    Subjective The heat was helpful   Currently in Pain? Yes   Pain Score 4    Pain Location Shoulder   Pain Orientation Left   Pain Descriptors / Indicators Sore            [facility] PT Assessment - 09/30/14 0957    AROM   Left Shoulder Internal Rotation --  reach to T-10 after session                     [facility] Adult PT Treatment/Exercise - 09/30/14 0001    Shoulder  Exercises: Sidelying   External Rotation Strengthening;Left;15 reps;Weights   External Rotation Weight (lbs) 2   ABduction AROM;20 reps   Shoulder Exercises: Standing   Extension Both;15 reps;Theraband   Theraband Level (Shoulder Extension) Level 2 (Red)   Row Both;15 reps;Theraband   Theraband Level (Shoulder Row) Level 2 (Red)   Other Standing Exercises Ue Ranger level 14 for flexion and horiz ab/adduction x 10 each -stepping forward with flexion, standing with Ue ranger on floor, progressing raising of Ue ranger every 10 reps with focus on decreased shoulder hike. Advanced to 2in, 4in and 6 in step with minimal increase on pain. Also standing Ue ranger used for AA IR while stepping right and left x 10- much improved IR ROM after this exercise reaching to T-10 with minimal pain.    Shoulder Exercises: Pulleys   Flexion 2 minutes   Shoulder Exercises: ROM/Strengthening   Other ROM/Strengthening Exercises supine cane pullovers x 10 and press ups x 10, with 2# wt on cane  horiz abdct/adduct x10   Modalities   Modalities Moist Heat   Moist Heat Therapy   Number Minutes Moist Heat 15 Minutes   Moist Heat Location  Shoulder  left supine                  PT Short Term Goals - 09/13/14 1401    PT SHORT TERM GOAL #1   Title she will be I with basic HEP (10/04/2014)   Time 3   Period Weeks   Status New   PT SHORT TERM GOAL #2   Title she will increase FOTO score by > 10 points to assist with funcitonal capacity (10/04/2014)   Time 3   Period Weeks   Status New   PT SHORT TERM GOAL #3   Title she will be able to verbalize and demonstrate techniques to reduce inflammation via RICE method (10/04/2014)   Time 3   Period Weeks   Status New           PT Long Term Goals - 09/13/14 1403    PT LONG TERM GOAL #1   Title pt will be I with advanced HEP (10/25/2014)   Time 6   Period Weeks   Status New   PT LONG TERM GOAL #2   Title pt will increase L shoulder AROM by > 20 degrees  in all planes to assist with ADLs (10/25/2014)   Time 6   Period Weeks   Status New   PT LONG TERM GOAL #3   Title She will Increase L shoulder strength to > 4/5 to assist wtih her goal of getting back to gardening (10/25/2014)   Time 6   Period Weeks   Status New   PT LONG TERM GOAL #4   Title pt will decrease pain to < 3/10 during and following lifting > 5# above her head to assist with cooking and other ADLs (10/25/2014)   Time 6   Period Weeks   Status New   PT LONG TERM GOAL #5   Title pt will increase FOTO score to > 59 to assist with functional capacity upon discharge (10/25/2014)   Time 6   Period Weeks   Status New               Plan - 09/30/14 1012    Clinical Impression Statement Pt presents with decreased pain and increased ROM since last visit. Focus today on ROM without shoulder hike as well as strengthening. Pt tolerated session well with only minimal increase in pain. IR ROM improved. Progressing toward goals.    PT Next Visit Plan continue UE ranger exercises,  shoulder/ scapular strengthening, manual, modalities for pain        Problem List Patient Active Problem List   Diagnosis Date Noted  . Left-sided weakness   . 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 Facial droop 07/23/2014  . Left leg pain 07/23/2014  . Left shoulder pain 07/23/2014  . Preventative health care 01/14/2014  . Solitary pulmonary nodule 07/20/2013  . Rectocele 05/18/2013  . Abdominal hernia 12/18/2012  . Female bladder prolapse, acquired 10/21/2012  . Mixed incontinence 10/21/2012  . HLD (hyperlipidemia) 03/26/2012  . Asthma 03/25/2012  . MENOPAUSE, SURGICAL 12/10/2006  . HYPOTHYROIDISM 12/09/2006    Dorene Ar, PTA 09/30/2014, 10:16 AM  [facility] [address] [place], [place], [address] Phone: [phone]   Fax:  [phone]

## 2014-10-04 ENCOUNTER — Ambulatory Visit: Payer: No Typology Code available for payment source | Admitting: Physical Therapy

## 2014-10-04 DIAGNOSIS — M25512 Pain in left shoulder: Secondary | ICD-10-CM

## 2014-10-04 DIAGNOSIS — R29898 Other symptoms and signs involving the musculoskeletal system: Secondary | ICD-10-CM

## 2014-10-04 DIAGNOSIS — R293 Abnormal posture: Secondary | ICD-10-CM

## 2014-10-04 DIAGNOSIS — M25612 Stiffness of left shoulder, not elsewhere classified: Secondary | ICD-10-CM

## 2014-10-04 NOTE — Therapy (Signed)
[place], [place], [address] Phone: [phone]   Fax:  [phone]  Physical Therapy Treatment  Patient Details  Name: Sara Haas MRN: [id number] Date of Birth: 1955-06-03 Referring Provider:  Alveda Reasons, MD  Encounter Date: 10/04/2014      PT End of Session - 10/04/14 1018    Visit Number 4   Number of Visits 12   Date for PT Re-Evaluation 10/25/14   PT Start Time 0935   PT Stop Time 1016   PT Time Calculation (min) 41 min      Past Medical History  Diagnosis Date  . Thyroid disease     hypothyroidism  . Hyperlipidemia   . Asthma     Past Surgical History  Procedure Laterality Date  . Cystocele repair  2009  . Abdominal hysterectomy    . Bladder surgery    . Anterior and posterior repair N/A 05/18/2013    Procedure:  REPAIR OF RECTOCELE WITH GRAFT,REPAIR OF ENTEROCELE ;  Surgeon: Reece Packer, MD;  Location: [facility];  Service: Urology;  Laterality: N/A;  . Vaginal prolapse repair N/A 05/18/2013    Procedure:  VAULT PROLAPSE ;  Surgeon: Reece Packer, MD;  Location: [facility];  Service: Urology;  Laterality: N/A;  . Cystoscopy N/A 05/18/2013    Procedure: CYSTOSCOPY;  Surgeon: Reece Packer, MD;  Location: [facility];  Service: Urology;  Laterality: N/A;  . Thyroidectomy      There were no vitals filed for this visit.  Visit Diagnosis:  Left shoulder pain  Weakness of left arm  Decreased ROM of left shoulder  Abnormal posture      Subjective Assessment - 10/04/14 0937    Subjective No pain   Currently in Pain? No/denies   Aggravating Factors  end range flexion   Pain Relieving Factors not performing end range flexion            [facility] PT Assessment - 10/04/14 0940    AROM   Left Shoulder Flexion 130 Degrees   Left Shoulder ABduction 130 Degrees   Left Shoulder Internal Rotation --  t-10   Left Shoulder External Rotation --  reach to T-2   Strength   Left Shoulder Extension 4/5   Left Shoulder ABduction 4-/5   Left Shoulder Internal Rotation 4+/5   Left Shoulder External Rotation 4-/5                     [facility] Adult PT Treatment/Exercise - 10/04/14 0945    Shoulder Exercises: Standing   External Rotation Strengthening;Left;15 reps;Theraband   Theraband Level (Shoulder External Rotation) Level 3 (Green)   Internal Rotation Strengthening;Left;15 reps;Theraband   Theraband Level (Shoulder Internal Rotation) Level 3 (Green)   Flexion Strengthening;Left;15 reps;Theraband   Theraband Level (Shoulder Flexion) Level 3 (Green)   Flexion Limitations punch   Extension Both;15 reps;Theraband   Theraband Level (Shoulder Extension) Level 3 (Green)   Row Both;15 reps;Theraband   Theraband Level (Shoulder Row) Level 3 (Green)   Other Standing Exercises yellow band scaption reach at wall overhead x 10    Other Standing Exercises Ue ranger level 14 flexion with step forward x 10 then added horiz abdct with every step x 10,    Shoulder Exercises: Pulleys   Flexion 2 minutes  tactile cues for scapular depression   ABduction 2 minutes   Shoulder Exercises: ROM/Strengthening   UBE (Upper Arm Bike) Level 1 3 min forward, 3  min back   Wall Wash 1 minute for flexion, then ball on wall flexion circles and abductionn                PT Education - 10/04/14 1018    Education Details Wall washes for flexion and overhead horizontal abduction and adduction, ER/IR with red band   Person(s) Educated Patient   Methods Explanation;Demonstration;Verbal cues;Tactile cues   Comprehension Verbalized understanding          PT Short Term Goals - 10/04/14 0947    PT SHORT TERM GOAL #1   Title she will be I with basic HEP (10/04/2014)   Time 3   Period Weeks   Status Achieved   PT SHORT TERM GOAL #2   Title she will increase FOTO score by > 10 points to assist with funcitonal capacity (10/04/2014)   Time 3   Period Weeks    Status On-going   PT SHORT TERM GOAL #3   Title she will be able to verbalize and demonstrate techniques to reduce inflammation via RICE method (10/04/2014)   Time 3   Period Weeks   Status Achieved           PT Long Term Goals - 10/04/14 0947    PT LONG TERM GOAL #1   Title pt will be I with advanced HEP (10/25/2014)   Time 6   Period Weeks   Status On-going   PT LONG TERM GOAL #2   Title pt will increase L shoulder AROM by > 20 degrees in all planes to assist with ADLs (10/25/2014)   Time 6   Period Weeks   Status Partially Met   PT LONG TERM GOAL #3   Title She will Increase L shoulder strength to > 4/5 to assist wtih her goal of getting back to gardening (10/25/2014)   Time 6   Period Weeks   Status Partially Met   PT LONG TERM GOAL #4   Title pt will decrease pain to < 3/10 during and following lifting > 5# above her head to assist with cooking and other ADLs (10/25/2014)   Time 6   Period Weeks   Status Unable to assess   PT LONG TERM GOAL #5   Title pt will increase FOTO score to > 59 to assist with functional capacity upon discharge (10/25/2014)   Time 6   Period Weeks   Status On-going               Plan - 10/04/14 0946    Clinical Impression Statement Pt with significant improvement in ROM and decreased pain. Focus today on strength and overhead ROM.  STG# 1,3 LTG# 2,3 partially Met. No increased pain post treatment. Declined heat.    PT Next Visit Plan continue UE ranger exercises,  shoulder/ scapular strengthening        Problem List Patient Active Problem List   Diagnosis Date Noted  . Left-sided weakness   . 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 Facial droop 07/23/2014  . Left leg pain 07/23/2014  . Left shoulder pain 07/23/2014  . Preventative health care 01/14/2014  . Solitary pulmonary nodule 07/20/2013  . Rectocele 05/18/2013  . Abdominal hernia 12/18/2012  . Female bladder prolapse, acquired 10/21/2012  . Mixed incontinence 10/21/2012  .  HLD (hyperlipidemia) 03/26/2012  . Asthma 03/25/2012  . MENOPAUSE, SURGICAL 12/10/2006  . HYPOTHYROIDISM 12/09/2006    Dorene Ar, PTA 10/04/2014, 10:21 AM  [place]  [place], [place], [address] Phone: [phone]   Fax:  [phone]

## 2014-10-07 ENCOUNTER — Ambulatory Visit: Payer: No Typology Code available for payment source | Admitting: Physical Therapy

## 2014-10-07 DIAGNOSIS — R29898 Other symptoms and signs involving the musculoskeletal system: Secondary | ICD-10-CM

## 2014-10-07 DIAGNOSIS — M25612 Stiffness of left shoulder, not elsewhere classified: Secondary | ICD-10-CM

## 2014-10-07 DIAGNOSIS — M25512 Pain in left shoulder: Secondary | ICD-10-CM

## 2014-10-07 DIAGNOSIS — R293 Abnormal posture: Secondary | ICD-10-CM

## 2014-10-07 NOTE — Therapy (Signed)
[place], [place], [address] Phone: [phone]   Fax:  [phone]  Physical Therapy Treatment  Patient Details  Name: Sara Haas MRN: [id number] Date of Birth: 08-28-1955 Referring Provider:  Alveda Reasons, MD  Encounter Date: 10/07/2014      PT End of Session - 10/07/14 0958    Visit Number 5   Number of Visits 12   Date for PT Re-Evaluation 10/25/14   PT Start Time 0938   PT Stop Time 1017   PT Time Calculation (min) 39 min      Past Medical History  Diagnosis Date  . Thyroid disease     hypothyroidism  . Hyperlipidemia   . Asthma     Past Surgical History  Procedure Laterality Date  . Cystocele repair  2009  . Abdominal hysterectomy    . Bladder surgery    . Anterior and posterior repair N/A 05/18/2013    Procedure:  REPAIR OF RECTOCELE WITH GRAFT,REPAIR OF ENTEROCELE ;  Surgeon: Reece Packer, MD;  Location: [facility];  Service: Urology;  Laterality: N/A;  . Vaginal prolapse repair N/A 05/18/2013    Procedure:  VAULT PROLAPSE ;  Surgeon: Reece Packer, MD;  Location: [facility];  Service: Urology;  Laterality: N/A;  . Cystoscopy N/A 05/18/2013    Procedure: CYSTOSCOPY;  Surgeon: Reece Packer, MD;  Location: [facility];  Service: Urology;  Laterality: N/A;  . Thyroidectomy      There were no vitals filed for this visit.  Visit Diagnosis:  Left shoulder pain  Weakness of left arm  Decreased ROM of left shoulder  Abnormal posture                       [facility] Adult PT Treatment/Exercise - 10/07/14 0953    Shoulder Exercises: Standing   External Rotation Strengthening;Left;15 reps;Theraband   Theraband Level (Shoulder External Rotation) Level 3 (Green)   Internal Rotation Strengthening;Left;15 reps;Theraband   Theraband Level (Shoulder Internal Rotation) Level 3 (Green)   Flexion Strengthening;Left;20 reps;Theraband   Theraband Level (Shoulder  Flexion) Level 3 (Green)   Flexion Limitations punch   Extension Both;20 reps;Theraband   Theraband Level (Shoulder Extension) Level 3 (Green)   Row [employer];Theraband   Theraband Level (Shoulder Row) Level 3 (Green)   Other Standing Exercises yellow band scaption reach at wall overhead x 10    Other Standing Exercises Ue ranger level 14 flexion with step forward x 20   Shoulder Exercises: Pulleys   Flexion 2 minutes  tactile cues for scapular depression   ABduction 2 minutes   Shoulder Exercises: ROM/Strengthening   UBE (Upper Arm Bike) L 1.5 for 3 min back x 3 min   Wall Wash 1 minute for flexion, then ball on wall flexion circles and abductionn   Other ROM/Strengthening Exercises ball on wall flexion and abduction   1 minute each   Other ROM/Strengthening Exercises standing cabinet reach 3#, 5 # no pain multiple reps. No pain. Standing forward and leteral raises 2 # x 10 each, over head press 2# x 10, no pain                  PT Short Term Goals - 10/04/14 0947    PT SHORT TERM GOAL #1   Title she will be I with basic HEP (10/04/2014)   Time 3   Period Weeks   Status Achieved   PT SHORT TERM GOAL #  2   Title she will increase FOTO score by > 10 points to assist with funcitonal capacity (10/04/2014)   Time 3   Period Weeks   Status On-going   PT SHORT TERM GOAL #3   Title she will be able to verbalize and demonstrate techniques to reduce inflammation via RICE method (10/04/2014)   Time 3   Period Weeks   Status Achieved           PT Long Term Goals - 10/04/14 0947    PT LONG TERM GOAL #1   Title pt will be I with advanced HEP (10/25/2014)   Time 6   Period Weeks   Status On-going   PT LONG TERM GOAL #2   Title pt will increase L shoulder AROM by > 20 degrees in all planes to assist with ADLs (10/25/2014)   Time 6   Period Weeks   Status Partially Met   PT LONG TERM GOAL #3   Title She will Increase L shoulder strength to > 4/5 to assist wtih her goal of  getting back to gardening (10/25/2014)   Time 6   Period Weeks   Status Partially Met   PT LONG TERM GOAL #4   Title pt will decrease pain to < 3/10 during and following lifting > 5# above her head to assist with cooking and other ADLs (10/25/2014)   Time 6   Period Weeks   Status Unable to assess   PT LONG TERM GOAL #5   Title pt will increase FOTO score to > 59 to assist with functional capacity upon discharge (10/25/2014)   Time 6   Period Weeks   Status On-going               Plan - 10/07/14 1008    Clinical Impression Statement Pt reports using water bottle for weights at home. Instructed pt in safe exercses with weight. Including bicep curl, overhead press, forward and lateral raises. Pt able to return demonstrate with good technique and no shoulder hike. No pian with lifting 5# wt into overhead cabinet. Pt reports she is carrying large pots of hot water to heat her grandson's pool without pain.    PT Next Visit Plan continue standing shoulder strength, RTC strength, overhead strength /rom. Check goals.         Problem List Patient Active Problem List   Diagnosis Date Noted  . Left-sided weakness   . 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 Facial droop 07/23/2014  . Left leg pain 07/23/2014  . Left shoulder pain 07/23/2014  . Preventative health care 01/14/2014  . Solitary pulmonary nodule 07/20/2013  . Rectocele 05/18/2013  . Abdominal hernia 12/18/2012  . Female bladder prolapse, acquired 10/21/2012  . Mixed incontinence 10/21/2012  . HLD (hyperlipidemia) 03/26/2012  . Asthma 03/25/2012  . MENOPAUSE, SURGICAL 12/10/2006  . HYPOTHYROIDISM 12/09/2006    Dorene Ar, PTA 10/07/2014, 11:00 AM  [place], [place], [address] Phone: [phone]   Fax:  [phone]

## 2014-10-11 ENCOUNTER — Ambulatory Visit: Payer: No Typology Code available for payment source | Admitting: Physical Therapy

## 2014-10-11 DIAGNOSIS — M25612 Stiffness of left shoulder, not elsewhere classified: Secondary | ICD-10-CM

## 2014-10-11 DIAGNOSIS — R293 Abnormal posture: Secondary | ICD-10-CM

## 2014-10-11 DIAGNOSIS — M25512 Pain in left shoulder: Secondary | ICD-10-CM

## 2014-10-11 DIAGNOSIS — R29898 Other symptoms and signs involving the musculoskeletal system: Secondary | ICD-10-CM

## 2014-10-11 NOTE — Therapy (Signed)
[place] Ha[place], [place], [address] Phone: [phone]   Fax:  [phone]  Physical Therapy Treatment  Patient Details  Name: Sara Haas MRN: [id number] Date of Birth: [birth year] Referring Provider:  Alveda Reasons, MD  Encounter Date: 10/11/2014      PT End of Session - 10/11/14 1109    Visit Number 6   Number of Visits 12   Date for PT Re-Evaluation 10/25/14   PT Start Time 2458   PT Stop Time 1100   PT Time Calculation (min) 45 min   Activity Tolerance Patient tolerated treatment well   Behavior During Therapy [facility] for tasks assessed/performed      Past Medical History  Diagnosis Date  . Thyroid disease     hypothyroidism  . Hyperlipidemia   . Asthma     Past Surgical History  Procedure Laterality Date  . Cystocele repair  2009  . Abdominal hysterectomy    . Bladder surgery    . Anterior and posterior repair N/A 05/18/2013    Procedure:  REPAIR OF RECTOCELE WITH GRAFT,REPAIR OF ENTEROCELE ;  Surgeon: Reece Packer, MD;  Location: [facility];  Service: Urology;  Laterality: N/A;  . Vaginal prolapse repair N/A 05/18/2013    Procedure:  VAULT PROLAPSE ;  Surgeon: Reece Packer, MD;  Location: [facility];  Service: Urology;  Laterality: N/A;  . Cystoscopy N/A 05/18/2013    Procedure: CYSTOSCOPY;  Surgeon: Reece Packer, MD;  Location: [facility];  Service: Urology;  Laterality: N/A;  . Thyroidectomy      There were no vitals filed for this visit.  Visit Diagnosis:  Left shoulder pain  Weakness of left arm  Decreased ROM of left shoulder  Abnormal posture      Subjective Assessment - 10/11/14 1028    Subjective pt reports no pain, and that she has been being consistent with her HEP   Currently in Pain? Yes   Pain Score 0-No pain   Pain Location Shoulder            [facility] PT Assessment - 10/11/14 0001    AROM   Left Shoulder Flexion 140 Degrees   Left Shoulder  ABduction 132 Degrees                     [facility] Adult PT Treatment/Exercise - 10/11/14 0001    Shoulder Exercises: Seated   Retraction AROM;Strengthening;Both;15 reps;Theraband  with ER   Theraband Level (Shoulder Retraction) Level 2 (Red)   Shoulder Exercises: Standing   External Rotation Strengthening;Left;15 reps;Theraband   Theraband Level (Shoulder External Rotation) Level 3 (Green)   Internal Rotation Strengthening;Left;15 reps;Theraband   Theraband Level (Shoulder Internal Rotation) Level 3 (Green)   Flexion Strengthening;Left;20 reps;Theraband   Theraband Level (Shoulder Flexion) Level 3 (Green)   Extension Both;20 reps;Theraband   Theraband Level (Shoulder Extension) Level 4 (Blue)   Row Both;20 reps;Theraband   Theraband Level (Shoulder Row) Level 4 (Blue)   Other Standing Exercises D1 and D2 with red theraband 2 x 10   Shoulder Exercises: Pulleys   Flexion 2 minutes   ABduction 2 minutes   Shoulder Exercises: ROM/Strengthening   UBE (Upper Arm Bike) L 1.5 for 3 min back x 3 min   Wall Wash 3 x 20 sec with towel on wall   Other ROM/Strengthening Exercises standing cabinet reach 3#, 5 # no pain multiple reps. No pain. Standing forward and leteral raises 2 # x 10  each, over head press 2# x 10, no pain                PT Education - 10/11/14 1108    Education provided Yes   Education Details continuing scaption exercises with red theraband   Person(s) Educated Patient   Methods Explanation   Comprehension Verbalized understanding          PT Short Term Goals - 10/04/14 0947    PT SHORT TERM GOAL #1   Title she will be I with basic HEP (10/04/2014)   Time 3   Period Weeks   Status Achieved   PT SHORT TERM GOAL #2   Title she will increase FOTO score by > 10 points to assist with funcitonal capacity (10/04/2014)   Time 3   Period Weeks   Status On-going   PT SHORT TERM GOAL #3   Title she will be able to verbalize and demonstrate techniques  to reduce inflammation via RICE method (10/04/2014)   Time 3   Period Weeks   Status Achieved           PT Long Term Goals - 10/04/14 0947    PT LONG TERM GOAL #1   Title pt will be I with advanced HEP (10/25/2014)   Time 6   Period Weeks   Status On-going   PT LONG TERM GOAL #2   Title pt will increase L shoulder AROM by > 20 degrees in all planes to assist with ADLs (10/25/2014)   Time 6   Period Weeks   Status Partially Met   PT LONG TERM GOAL #3   Title She will Increase L shoulder strength to > 4/5 to assist wtih her goal of getting back to gardening (10/25/2014)   Time 6   Period Weeks   Status Partially Met   PT LONG TERM GOAL #4   Title pt will decrease pain to < 3/10 during and following lifting > 5# above her head to assist with cooking and other ADLs (10/25/2014)   Time 6   Period Weeks   Status Unable to assess   PT LONG TERM GOAL #5   Title pt will increase FOTO score to > 59 to assist with functional capacity upon discharge (10/25/2014)   Time 6   Period Weeks   Status On-going               Plan - 10/11/14 1109    Clinical Impression Statement [name] presents to therapy today stating she has no pain in the shoulder and is making progres with increased ROM with mild pain noted during abduction. She tolerated all exercises well today without increase in symptoms. she report sbing consistent with her HEP and that it is helping her.  Progressed scaption exercises to red theraband.    PT Next Visit Plan continue standing shoulder strength, RTC strength, overhead strength /rom. Check goals.    Recommended Other Services progress scapton exercise to red theraband        Problem List Patient Active Problem List   Diagnosis Date Noted  . Left-sided weakness   . 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 Facial droop 07/23/2014  . Left leg pain 07/23/2014  . Left shoulder pain 07/23/2014  . Preventative health care 01/14/2014  . Solitary pulmonary nodule  07/20/2013  . Rectocele 05/18/2013  . Abdominal hernia 12/18/2012  . Female bladder prolapse, acquired 10/21/2012  . Mixed incontinence 10/21/2012  . HLD (hyperlipidemia) 03/26/2012  . Asthma 03/25/2012  .  MENOPAUSE, SURGICAL 12/10/2006  . HYPOTHYROIDISM 12/09/2006   Starr Lake PT, DPT, LAT, ATC  10/11/2014  11:12 AM    [facility] [address] [place], [place], [address] Phone: [phone]   Fax:  [phone]

## 2014-10-13 ENCOUNTER — Ambulatory Visit: Payer: No Typology Code available for payment source | Admitting: Physical Therapy

## 2014-10-14 ENCOUNTER — Encounter: Payer: No Typology Code available for payment source | Admitting: Physical Therapy

## 2014-10-18 ENCOUNTER — Ambulatory Visit: Payer: No Typology Code available for payment source | Admitting: Physical Therapy

## 2014-10-18 DIAGNOSIS — R29898 Other symptoms and signs involving the musculoskeletal system: Secondary | ICD-10-CM

## 2014-10-18 DIAGNOSIS — R293 Abnormal posture: Secondary | ICD-10-CM

## 2014-10-18 DIAGNOSIS — M25612 Stiffness of left shoulder, not elsewhere classified: Secondary | ICD-10-CM

## 2014-10-18 DIAGNOSIS — M25512 Pain in left shoulder: Secondary | ICD-10-CM

## 2014-10-18 NOTE — Therapy (Addendum)
[place], [place], [address] Phone: [phone]   Fax:  [phone]  Physical Therapy Treatment  Patient Details  Name: Sara Haas MRN: [id number] Date of Birth: 1956/02/20 Referring Provider:  Alveda Reasons, MD  Encounter Date: 10/18/2014      PT End of Session - 10/18/14 1001    Visit Number 7   Number of Visits 12   Date for PT Re-Evaluation 10/25/14   PT Start Time 0947   PT Stop Time 1026   PT Time Calculation (min) 39 min      Past Medical History  Diagnosis Date  . Thyroid disease     hypothyroidism  . Hyperlipidemia   . Asthma     Past Surgical History  Procedure Laterality Date  . Cystocele repair  2009  . Abdominal hysterectomy    . Bladder surgery    . Anterior and posterior repair N/A 05/18/2013    Procedure:  REPAIR OF RECTOCELE WITH GRAFT,REPAIR OF ENTEROCELE ;  Surgeon: Reece Packer, MD;  Location: [facility];  Service: Urology;  Laterality: N/A;  . Vaginal prolapse repair N/A 05/18/2013    Procedure:  VAULT PROLAPSE ;  Surgeon: Reece Packer, MD;  Location: [facility];  Service: Urology;  Laterality: N/A;  . Cystoscopy N/A 05/18/2013    Procedure: CYSTOSCOPY;  Surgeon: Reece Packer, MD;  Location: [facility];  Service: Urology;  Laterality: N/A;  . Thyroidectomy      There were no vitals filed for this visit.  Visit Diagnosis:  Left shoulder pain  Weakness of left arm  Decreased ROM of left shoulder  Abnormal posture      Subjective Assessment - 10/18/14 0951    Currently in Pain? No/denies            [facility] PT Assessment - 10/18/14 1002    Observation/Other Assessments   Focus on Therapeutic Outcomes (FOTO)  7% limited today, 70% limitation initial  predicted 41%   AROM   Left Shoulder Flexion 147 Degrees   Left Shoulder ABduction 152 Degrees   Left Shoulder Internal Rotation 61 Degrees   Left Shoulder External Rotation 71 Degrees    Strength   Right/Left Shoulder Left   Left Shoulder Flexion 4/5   Left Shoulder Extension 4/5   Left Shoulder ABduction 4+/5   Left Shoulder Internal Rotation 4+/5   Left Shoulder External Rotation 4/5                     [facility] Adult PT Treatment/Exercise - 10/18/14 1330    Shoulder Exercises: Pulleys   Flexion 2 minutes   ABduction 2 minutes   Shoulder Exercises: ROM/Strengthening   UBE (Upper Arm Bike) L2 x 8 minutes forward and back alteranting   Other ROM/Strengthening Exercises standing 5# bilateral lift into cabinet above shoulder height, then 7# x 10 , 8# x10 without increased pain just fatigue                PT Education - 10/18/14 1354    Education provided Yes   Education Details SELF CARE: Continue HEP, green bands issued for HEP exercises, review of HEP   Person(s) Educated Patient   Methods Explanation  via interpreter   Comprehension Verbalized understanding          PT Short Term Goals - 10/18/14 1324    PT SHORT TERM GOAL #1   Title she will be I with basic HEP (10/04/2014)  Time 3   Period Weeks   Status Achieved   PT SHORT TERM GOAL #2   Title she will increase FOTO score by > 10 points to assist with funcitonal capacity (10/04/2014)   Time 3   Period Weeks   Status Achieved   PT SHORT TERM GOAL #3   Title she will be able to verbalize and demonstrate techniques to reduce inflammation via RICE method (10/04/2014)   Time 3   Period Weeks   Status Achieved           PT Long Term Goals - 10/18/14 1325    PT LONG TERM GOAL #1   Title pt will be I with advanced HEP (10/25/2014)   Time 6   Period Weeks   Status Achieved   PT LONG TERM GOAL #2   Title pt will increase L shoulder AROM by > 20 degrees in all planes to assist with ADLs (10/25/2014)   Time 6   Period Weeks   Status Achieved   PT LONG TERM GOAL #3   Title She will Increase L shoulder strength to > 4/5 to assist wtih her goal of getting back to gardening (10/25/2014)    Time 6   Period Weeks   Status Partially Met   PT LONG TERM GOAL #4   Title pt will decrease pain to < 3/10 during and following lifting > 5# above her head to assist with cooking and other ADLs (10/25/2014)   Time 6   Period Weeks   Status Achieved   PT LONG TERM GOAL #5   Title pt will increase FOTO score to > 59 to assist with functional capacity upon discharge (10/25/2014)   Time 6   Period Weeks   Status Achieved               Plan - 10/18/14 1325    Clinical Impression Statement Pt's FOTO score improved to only 7% limited from 70% limited on initial eval. All STG's and LTG's MET except LTG #3 partially met. See objective measures. Pt reports no pain with any functional activity. She is independent with her HEP and was given advanced therabands to continue progression at home.    PT Next Visit Plan discharge today        Problem List Patient Active Problem List   Diagnosis Date Noted  . Left-sided weakness   . 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 Facial droop 07/23/2014  . Left leg pain 07/23/2014  . Left shoulder pain 07/23/2014  . Preventative health care 01/14/2014  . Solitary pulmonary nodule 07/20/2013  . Rectocele 05/18/2013  . Abdominal hernia 12/18/2012  . Female bladder prolapse, acquired 10/21/2012  . Mixed incontinence 10/21/2012  . HLD (hyperlipidemia) 03/26/2012  . Asthma 03/25/2012  . MENOPAUSE, SURGICAL 12/10/2006  . HYPOTHYROIDISM 12/09/2006    Hessie Diener, PTA 10/18/2014 1:58 PM Phone: [phone] Fax: [place], [place], [address] Phone: [phone]   Fax:  [phone]                    PHYSICAL THERAPY DISCHARGE SUMMARY  Visits from Start of Care: 7  Current functional level related to goals / functional outcomes: 7% limited   Remaining deficits: N/A   Education / Equipment: HEP handout, and education   Plan: Patient agrees to discharge.  Patient goals were met. Patient is being discharged due to meeting the stated rehab goals.  ?????  Kristoffer Leamon PT, DPT, LAT, ATC  10/18/2014  2:54 PM

## 2014-10-21 ENCOUNTER — Ambulatory Visit: Payer: No Typology Code available for payment source | Admitting: Physical Therapy

## 2014-11-01 ENCOUNTER — Encounter: Payer: No Typology Code available for payment source | Admitting: Physical Therapy

## 2014-12-13 ENCOUNTER — Encounter: Payer: Self-pay | Admitting: Family Medicine

## 2014-12-13 ENCOUNTER — Ambulatory Visit (INDEPENDENT_AMBULATORY_CARE_PROVIDER_SITE_OTHER): Payer: No Typology Code available for payment source | Admitting: Family Medicine

## 2014-12-13 VITALS — BP 111/69 | HR 90 | Temp 98.5°F | Ht 60.0 in | Wt 128.1 lb

## 2014-12-13 DIAGNOSIS — R911 Solitary pulmonary nodule: Secondary | ICD-10-CM

## 2014-12-13 DIAGNOSIS — I726 Aneurysm of vertebral artery: Secondary | ICD-10-CM

## 2014-12-13 DIAGNOSIS — I720 Aneurysm of carotid artery: Secondary | ICD-10-CM

## 2014-12-13 DIAGNOSIS — H5712 Ocular pain, left eye: Secondary | ICD-10-CM

## 2014-12-13 NOTE — Progress Notes (Signed)
Subjective:    Sara Haas is a 59 y.o. female who presents to [facility] today for 2 issues:  1.  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  However, did discover tortuous vertebral artery anuerysm unchanged from 2012 on Left side.  13x14x15 mm and slightly thrombosed.  Supposed to FU with Neurology -- but was told they don't take the [facility].  Daughter shows me letter with [facility] financial assistance which extends to September -- unclear if neurology takes this.  No further episodes of weakness, facial droop, or neurological concerns.  2.  Eye pain:  Describes as "deep" eye pain which is dull ache, 3/10 for past several days.  No foreign body sensation.  States this eye is "weaker" and has been for years.  No change in vision.  No redness.  No trauma.    ROS as above per HPI, otherwise neg.   The following portions of the patient's history were reviewed and updated as appropriate: allergies, current medications, past medical history, family and social history, and problem list. Patient is a nonsmoker.    PMH reviewed.  Past Medical History  Diagnosis Date  . Thyroid disease     hypothyroidism  . Hyperlipidemia   . Asthma    Past Surgical History  Procedure Laterality Date  . Cystocele repair  2009  . Abdominal hysterectomy    . Bladder surgery    . Anterior and posterior repair N/A 05/18/2013    Procedure:  REPAIR OF RECTOCELE WITH GRAFT,REPAIR OF ENTEROCELE ;  Surgeon: Reece Packer, MD;  Location: [facility];  Service: Urology;  Laterality: N/A;  . Vaginal prolapse repair N/A 05/18/2013    Procedure:  VAULT PROLAPSE ;  Surgeon: Reece Packer, MD;  Location: [facility];  Service: Urology;  Laterality: N/A;  . Cystoscopy N/A 05/18/2013    Procedure: CYSTOSCOPY;  Surgeon: Reece Packer, MD;  Location: [facility];  Service: Urology;  Laterality: N/A;  . Thyroidectomy      Medications reviewed. Current  Outpatient Prescriptions  Medication Sig Dispense Refill  . albuterol (PROVENTIL HFA;VENTOLIN HFA) 108 (90 BASE) MCG/ACT inhaler Inhale 2 puffs into the lungs every 6 (six) hours as needed for wheezing or shortness of breath. (Patient not taking: Reported on 09/13/2014) 1 Inhaler 0  . aspirin 81 MG chewable tablet Chew 4 tablets (324 mg total) by mouth daily. 30 tablet 3  . levothyroxine (SYNTHROID, LEVOTHROID) 75 MCG tablet Take 1 tablet (75 mcg total) by mouth daily. 90 tablet 3  . pravastatin (PRAVACHOL) 20 MG tablet Take 1 tablet (20 mg total) by mouth at bedtime. 90 tablet 3   No current facility-administered medications for this visit.     Objective:   Physical Exam BP 111/69 mmHg  Pulse 90  Temp(Src) 98.5 F (36.9 C) (Oral)  Ht 5' (1.524 m)  Wt 128 lb 1.6 oz (58.106 kg)  BMI 25.02 kg/m2 Gen:  Alert, cooperative patient who appears stated age in no acute distress.  Vital signs reviewed. HEENT: EOMI,  MMM.  No eye redness bilaterally. Sclera white. PERRL.  Normal disc, no hemorrhage noted.  No nystagmus.  No pain currently with eye.   Cardiac:  Regular rate and rhythm  Pulm:  Clear to auscultation bilaterally  Neuro:  Alert and oriented.  No focal deficits noted.    Psych:  Pleasant.   No results found for this or any previous visit (from the past 72  hour(s)).

## 2014-12-15 DIAGNOSIS — H5710 Ocular pain, unspecified eye: Secondary | ICD-10-CM | POA: Insufficient documentation

## 2014-12-15 NOTE — Assessment & Plan Note (Signed)
Needs to be followed by Neurology.   According to Eusebio Friendly, [facility] will take [facility].  I will refer her now.   No complaints or current symptoms.

## 2014-12-15 NOTE — Assessment & Plan Note (Addendum)
Very mild.  Denies any symptoms of corneal abrasion.   Both history and physical are very reassuring.   Seems to be more eye strain.  Left eye is 20/40.  Right is 20/30.   Both she and daughter states that her she needs new prescriptions. To call or follow up if pain worsens or persists.  Gave return precautions of eye redness, change in vision, or worsening pain.

## 2015-01-02 ENCOUNTER — Ambulatory Visit: Payer: No Typology Code available for payment source

## 2015-01-04 ENCOUNTER — Encounter: Payer: Self-pay | Admitting: Diagnostic Neuroimaging

## 2015-01-04 ENCOUNTER — Ambulatory Visit (INDEPENDENT_AMBULATORY_CARE_PROVIDER_SITE_OTHER): Payer: No Typology Code available for payment source | Admitting: Diagnostic Neuroimaging

## 2015-01-04 VITALS — BP 126/80 | HR 64 | Ht 60.0 in | Wt 126.6 lb

## 2015-01-04 DIAGNOSIS — I726 Aneurysm of vertebral artery: Secondary | ICD-10-CM

## 2015-01-04 DIAGNOSIS — M25512 Pain in left shoulder: Secondary | ICD-10-CM

## 2015-01-04 DIAGNOSIS — G510 Bell's palsy: Secondary | ICD-10-CM

## 2015-01-04 DIAGNOSIS — I720 Aneurysm of carotid artery: Secondary | ICD-10-CM

## 2015-01-04 NOTE — Patient Instructions (Signed)
Repeat CT angiogram head.

## 2015-01-04 NOTE — Progress Notes (Signed)
[facility]  PATIENT: Sara Haas DOB: [birth year]  REFERRING CLINICIAN: Mingo Amber HISTORY FROM: patient and daughter (via interpreter) REASON FOR VISIT: new consult    HISTORICAL  CHIEF COMPLAINT:  Chief Complaint  Patient presents with  . Vertebral artery aneurysm    rm 7, dgtrBerdine Addison, interpreter -Sonia H    HISTORY OF PRESENT ILLNESS:   59 year old right-handed female here for evaluation of abnormal vertebral artery aneurysm.  In April 2016 patient presented to PCP. She reported 2 months sensation of strange feeling on left face. She also had 2-3 weeks of drooping in left eye and left lower face. Patient also was having left shoulder pain and weakness since November 2015. Patient was also having left leg problems. 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 MRI of the brain was obtained which showed large calcified partially thrombosed left sided aneurysm initially thought to be arising from the left vertebral artery. In retrospect this was also identified on CT cervical spine from 2012. Patient was admitted for evaluation. Neurosurgery consult was obtained. Cerebral angiogram demonstrated partially thrombosed aneurysm arising in fact from right vertebral artery, but very tortuous in course and deforming the left anterolateral medulla. Neurosurgery team recommended observation with serial imaging.  Due to patient's lack of insurance, patient was not able to follow-up in outpatient neurosurgery clinic. Therefore PCP referred patient to our clinic for evaluation.  Since hospital evaluation in April 2016, patient's left face symptoms have gradually improved. She still feels some mild abnormal sensation on her left side of her face. Her left shoulder has improved. Left leg also has improved.    REVIEW OF SYSTEMS: Full 14 system review of systems performed and  notable only for blurred vision double vision loss of vision depression not enough sleep this numbness headache confusion memory loss insomnia restless legs.  ALLERGIES: No Known Allergies  HOME MEDICATIONS: Outpatient Prescriptions Prior to Visit  Medication Sig Dispense Refill  . aspirin 81 MG chewable tablet Chew 4 tablets (324 mg total) by mouth daily. 30 tablet 3  . levothyroxine (SYNTHROID, LEVOTHROID) 75 MCG tablet Take 1 tablet (75 mcg total) by mouth daily. 90 tablet 3  . pravastatin (PRAVACHOL) 20 MG tablet Take 1 tablet (20 mg total) by mouth at bedtime. 90 tablet 3  . albuterol (PROVENTIL HFA;VENTOLIN HFA) 108 (90 BASE) MCG/ACT inhaler Inhale 2 puffs into the lungs every 6 (six) hours as needed for wheezing or shortness of breath. (Patient not taking: Reported on 09/13/2014) 1 Inhaler 0   No facility-administered medications prior to visit.    PAST MEDICAL HISTORY: Past Medical History  Diagnosis Date  . Thyroid disease     hypothyroidism  . Hyperlipidemia   . Asthma   . Aneurysm     PAST SURGICAL HISTORY: Past Surgical History  Procedure Laterality Date  . Cystocele repair  2009  . Abdominal hysterectomy    . Bladder surgery    . Anterior and posterior repair N/A 05/18/2013    Procedure:  REPAIR OF RECTOCELE WITH GRAFT,REPAIR OF ENTEROCELE ;  Surgeon: Reece Packer, MD;  Location: [facility];  Service: Urology;  Laterality: N/A;  . Vaginal prolapse repair N/A 05/18/2013    Procedure:  VAULT PROLAPSE ;  Surgeon: Reece Packer, MD;  Location: [facility];  Service: Urology;  Laterality: N/A;  . Cystoscopy N/A 05/18/2013    Procedure: CYSTOSCOPY;  Surgeon: Reece Packer, MD;  Location:  [facility];  Service: Urology;  Laterality: N/A;  . Thyroidectomy      FAMILY HISTORY: Family History  Problem Relation Age of Onset  . Heart disease Mother   . Asthma Mother   . Stroke Father   . Parkinson's disease Father   . Heart disease Sister   . Heart disease Brother      SOCIAL HISTORY:  Social History   Social History  . Marital Status: Single    Spouse Name: N/A  . Number of Children: 3  . Years of Education: 3   Occupational History  . Not on file.   Social History Main Topics  . Smoking status: Never Smoker   . Smokeless tobacco: Never Used  . Alcohol Use: No     Comment: social  . Drug Use: No  . Sexual Activity: No   Other Topics Concern  . Not on file   Social History Narrative   Lives at home with daughter    no caffeine use     PHYSICAL EXAM  GENERAL EXAM/CONSTITUTIONAL: Vitals:  Filed Vitals:   01/04/15 1003  BP: 126/80  Pulse: 64  Height: 5' (1.524 m)  Weight: 126 lb 9.6 oz (57.425 kg)     Body mass index is 24.72 kg/(m^2).  Visual Acuity Screening   Right eye Left eye Both eyes  Without correction: 20/40 20/40   With correction:        Patient is in no distress; well developed, nourished and groomed; neck is supple  CARDIOVASCULAR:  Examination of carotid arteries is normal; no carotid bruits  Regular rate and rhythm, no murmurs  Examination of peripheral vascular system by observation and palpation is normal  EYES:  Ophthalmoscopic exam of optic discs and posterior segments is normal; no papilledema or hemorrhages  MUSCULOSKELETAL:  Gait, strength, tone, movements noted in Neurologic exam below  NEUROLOGIC: MENTAL STATUS:  No flowsheet data found.  awake, alert, oriented to person, place and time  recent and remote memory intact  normal attention and concentration  language fluent, comprehension intact, naming intact,   fund of knowledge appropriate  COMMUNICATES VIA SPANISH INTERPRETER AND DAUGHTER  CRANIAL NERVE:   2nd - no papilledema on fundoscopic exam  2nd, 3rd, 4th, 6th - pupils equal and reactive to light, visual fields full to confrontation, extraocular muscles intact, no nystagmus  5th - facial sensation symmetric  7th - facial strength --> SLIGHT DECR IN LEFT  FOREHEAD RAISE AND DECR LEFT NASOLABIAL FOLD  8th - hearing intact  9th - palate elevates symmetrically, uvula midline  11th - shoulder shrug symmetric  12th - tongue protrusion midline  MOTOR:   normal bulk and tone, full strength in the BUE, BLE  SENSORY:   normal and symmetric to light touch, pinprick, temperature, vibration and proprioception  COORDINATION:   finger-nose-finger, fine finger movements, heel-shin normal  REFLEXES:   deep tendon reflexes present and symmetric  GAIT/STATION:   narrow based gait; able to walk on toes, heels and tandem; romberg is negative    DIAGNOSTIC DATA (LABS, IMAGING, TESTING) - I reviewed patient records, labs, notes, testing and imaging myself where available.  Lab Results  Component Value Date   WBC 9.2 08/11/2014   HGB 12.8 08/11/2014   HCT 39.6 08/11/2014   MCV 87.0 08/11/2014   PLT 218 08/11/2014      Component Value Date/Time   NA 136 08/14/2014 0808   K 4.5 08/14/2014 0808   CL 100 08/14/2014 4627  CO2 27 08/14/2014 0808   GLUCOSE 135* 08/14/2014 0808   BUN 18 08/14/2014 0808   CREATININE 0.59 08/14/2014 0808   CREATININE 0.61 12/03/2013 1149   CALCIUM 9.3 08/14/2014 0808   PROT 6.9 08/10/2014 1621   ALBUMIN 3.2* 08/10/2014 1621   AST 21 08/10/2014 1621   ALT 15 08/10/2014 1621   ALKPHOS 82 08/10/2014 1621   BILITOT 0.5 08/10/2014 1621   GFRNONAA >90 08/14/2014 0808   GFRAA >90 08/14/2014 0808   Lab Results  Component Value Date   CHOL 232* 12/03/2013   HDL 51 12/03/2013   LDLCALC 150* 12/03/2013   TRIG 156* 12/03/2013   CHOLHDL 4.5 12/03/2013   No results found for: HGBA1C No results found for: VITAMINB12 Lab Results  Component Value Date   TSH 0.472 08/10/2014    09/10/10 CT cervical spine  1. No significant abnormalities involving the cervical spine. 2. Approximate 12 mm calcified aneurysm involving the left vertebral artery, possibly the left PICA origin.  08/09/14 MRI brain (with and  without) [I reviewed images myself and agree with interpretation, except the aneurysm actually arises from the right vertebral artery. -VRP]  - Chronically calcified and unchanged from 2012 distal LEFT vertebral artery aneurysm measuring 13 x 14 x 15 mm. The aneurysm appears partially thrombosed. There is slight mass effect on the LEFT ventral medulla.  08/10/14 MRI cervical spine [I reviewed images myself and agree with interpretation, except the aneurysm actually arises from the right vertebral artery. -VRP]  1. Minimal cervical spondylosis without stenosis. 2. Distal left vertebral artery aneurysm as previously described.  08/12/14 cerebral angiogram [I reviewed images myself and agree with interpretation. -VRP]  1. 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 Access to this aneurysm for any endovascular treatment is precluded due to severe proximal tortuosity.     ASSESSMENT AND PLAN  59 y.o. year old female here with left peripheral CN VII palsy starting in March 2016, gradually progressing, with gradual resolution. Most likely represents Bell's palsy phenomenon.   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  Left arm and left leg symptoms also are most likely related to degenerative changes and musculoskeletal causes.   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 CT Angio Head W/Cm &/Or Wo Cm   Return in about 6 months (around 07/04/2015).    Penni Bombard, MD 5/85/2778, 24:23 AM Certified in Neurology, Neurophysiology  and Neuroimaging  [facility] [address] [place], [place] [address] [phone]

## 2015-01-13 ENCOUNTER — Ambulatory Visit
Admission: RE | Admit: 2015-01-13 | Discharge: 2015-01-13 | Disposition: A | Discharge status: Home / Self Care | Payer: No Typology Code available for payment source | Source: Ambulatory Visit | Attending: Diagnostic Neuroimaging | Admitting: Diagnostic Neuroimaging

## 2015-01-13 ENCOUNTER — Other Ambulatory Visit: Payer: No Typology Code available for payment source

## 2015-01-13 DIAGNOSIS — I726 Aneurysm of vertebral artery: Secondary | ICD-10-CM

## 2015-01-13 MED ORDER — IOPAMIDOL (ISOVUE-370) INJECTION 76%
80.0000 mL | Freq: Once | INTRAVENOUS | Status: AC | PRN
  Administered 2015-01-13: 80 mL via INTRAVENOUS

## 2015-01-31 ENCOUNTER — Other Ambulatory Visit: Payer: Self-pay | Admitting: Family Medicine

## 2015-07-03 ENCOUNTER — Ambulatory Visit: Payer: Self-pay

## 2015-07-03 ENCOUNTER — Telehealth: Payer: Self-pay | Admitting: Family Medicine

## 2015-07-03 DIAGNOSIS — K000 Anodontia: Secondary | ICD-10-CM

## 2015-07-03 NOTE — Telephone Encounter (Signed)
Patient has the orange card and is needing a referral to the dental clinic.  She had several teeth pulled and is needing to see a dentist regarding a partial plate.

## 2015-07-04 ENCOUNTER — Ambulatory Visit: Payer: No Typology Code available for payment source | Admitting: Diagnostic Neuroimaging

## 2015-07-04 NOTE — Telephone Encounter (Signed)
Spoke to [employer] and she stated that there is a dental clinic that takes the orange card and all you need to do is put in a referral and then she can fax notes to them for this.  Please let me know when you put referral in and I will contact pt with this information. Katharina Caper, Kaleisha Bhargava D, [place]

## 2015-07-04 NOTE — Telephone Encounter (Signed)
I'm not sure what we're doing with [facility] Card dental referrrals.  Please ask and then call patient to let her know.  We can mail or give her the sheet for dentists that take [facility] also.

## 2015-07-05 NOTE — Telephone Encounter (Signed)
I have put this order in.  Thanks!

## 2015-07-17 NOTE — Telephone Encounter (Signed)
Information has been sent for referral per Tia. Katharina Caper, April D, [place]

## 2015-08-01 ENCOUNTER — Other Ambulatory Visit: Payer: Self-pay | Admitting: Family Medicine

## 2015-09-04 ENCOUNTER — Other Ambulatory Visit: Payer: Self-pay | Admitting: Family Medicine

## 2015-12-04 ENCOUNTER — Other Ambulatory Visit: Payer: Self-pay | Admitting: Family Medicine

## 2015-12-22 ENCOUNTER — Ambulatory Visit (INDEPENDENT_AMBULATORY_CARE_PROVIDER_SITE_OTHER): Payer: No Typology Code available for payment source | Admitting: Obstetrics and Gynecology

## 2015-12-22 ENCOUNTER — Encounter: Payer: Self-pay | Admitting: Obstetrics and Gynecology

## 2015-12-22 VITALS — BP 116/75 | HR 69 | Temp 98.3°F | Ht 60.0 in | Wt 129.4 lb

## 2015-12-22 DIAGNOSIS — R21 Rash and other nonspecific skin eruption: Secondary | ICD-10-CM

## 2015-12-22 MED ORDER — DIPHENHYDRAMINE HCL 25 MG PO TABS: 25.0000 mg | ORAL_TABLET | Freq: Four times a day (QID) | ORAL | 0 refills | Status: DC | PRN

## 2015-12-22 MED ORDER — CLOTRIMAZOLE-BETAMETHASONE 1-0.05 % EX CREA: 1.0000 "application " | TOPICAL_CREAM | Freq: Two times a day (BID) | CUTANEOUS | 0 refills | Status: DC

## 2015-12-22 NOTE — Progress Notes (Signed)
   Subjective:   Patient ID: Sara Haas, female    DOB: 1955-05-27, 60 y.o.   MRN: [id number]  Patient presents for Same Day Appointment. Spanish Video Interpretor used [id number], [name]  Chief Complaint  Patient presents with  . Vaginal Rash    HPI: # RASH Location: in genital area and on inner thighs Medications tried: OTC medications Vagisil, monitstat Having lots of itching and burning Sudden onset Had rash for 3 months on vagina then on Sunday it spread to her inner legs Similar rash in past: no No skin breakdown New medications or antibiotics: no Tick, Insect or new pet exposure: no Recent travel: To [place] this past week; stayed for about 5 days. New detergent or soap: no Immunocompromised: no Only new thing patient mentions is that she switched the kind of feminine pad she wears about a month ago. Wears pad due to previous issues with incontinence but now s/p surgery 2014 but continues to wear them.   Symptoms Itching: yes Pain over rash:  Feeling ill all over: no Fever: no Face or tongue swelling: no Vaginal discharge: no  Review of Systems   See HPI for ROS.   Smoking status - Never smoker  Past medical history, surgical, family, and social history reviewed and updated in the EMR as appropriate.  Objective:  BP 116/75   Pulse 69   Temp 98.3 F (36.8 C) (Oral)   Ht 5' (1.524 m)   Wt 129 lb 6.4 oz (58.7 kg)   BMI 25.27 kg/m  Vitals and nursing note reviewed  Physical Exam  Constitutional: She is well-developed, well-nourished, and in no distress.  Genitourinary: Vagina normal. Vulva exhibits erythema, rash and tenderness. Vulva exhibits no exudate and no lesion.  Genitourinary Comments: Petechial rash that coalesces to form erythematous rash over mons pubis and spreads to inner thighs. Excoriations appreciated. No skin breakdown.     Assessment & Plan:  1. Rash of genital area HAS had rash for 3 months but now appears to be spreading. Rash  appearance consistent with a contact dermatitis. Most likely from her new feminine pads. Possible yeast component with h/o incontinence and wearing pad. Rx for Lotrisone given to apply to rash. Also Rx for benadryl to help with itching. Asked patient to discontinue feminine pad if possible to see if rash will clear as well. Return precautions discussed. Handout given.  Meds ordered this encounter  Medications  . clotrimazole-betamethasone (LOTRISONE) cream    Sig: Apply 1 application topically 2 (two) times daily.    Dispense:  45 g    Refill:  0  . diphenhydrAMINE (BENADRYL) 25 MG tablet    Sig: Take 1 tablet (25 mg total) by mouth every 6 (six) hours as needed for itching.    Dispense:  15 tablet    Refill:  0    Precepted with Dr. Wandra Mannan, DO 12/22/2015, 9:45 AM PGY-3, [place]

## 2015-12-22 NOTE — Patient Instructions (Signed)
Dermatitis de contacto (Contact Dermatitis) La dermatitis es el enrojecimiento, el dolor y la hinchazn (inflamacin) de la piel. La dermatitis de contacto es una reaccin a ciertas sustancias que entran en contacto con la piel. Toc algo que le irrit la piel o es alrgico a algo que ha tocado.  CUIDADOS EN EL HOGAR  Cuidado de la piel  Humctese la piel segn sea necesario.  Aplique compresas fras en las zonas afectadas.   Trate de tomar un bao con lo siguiente:   Sales de [place]. Siga las instrucciones del envase. Puede conseguirlas en la tienda de comestibles o en la farmacia local.   Bicarbonato de sodio. Vierta un poco en la baera como se lo haya indicado el [place] instrucciones del envase. Puede conseguirla en la tienda de comestibles o en la farmacia local.   Intente colocarse una pasta de bicarbonato de sodio sobre la piel. Agregue agua al bicarbonato de sodio hasta que formar una pasta.  No se rasque la piel.   Bese con menos frecuencia.  Bese con agua templada. No use agua caliente.  [place] o aplique los medicamentos de venta libre y los recetados solamente como se lo haya indicado el mdico.   Si le recetaron un antibitico, tmelo o aplqueselo como se lo haya indicado el mdico. No deje de tomar el antibitico aunque la afeccin empiece a Teacher, English as a foreign language. Instrucciones generales  Concurra a todas las visitas de control como se lo haya indicado el mdico. Esto es importante.   Evite la sustancia que ha causado la erupcin. Si no sabe qu la caus, lleve un diario para tratar de identificar la causa. Escriba los siguientes datos:   Lo que come.   Los cosmticos que [place].   Lo que bebe.   Lo que llev puesto en la zona afectada. [place] alhajas.   Si le indicaron que use un vendaje, cudelo como se lo haya indicado el mdico. Esto incluye saber cundo cambiarlo y cundo quitrselo.  SOLICITE AYUDA  SI:   No mejora con el tratamiento.   La afeccin empeora.   Tiene signos de infeccin, por ejemplo:  Hinchazn.  Dolor a Secretary/administrator.  Enrojecimiento.  Inflamacin.  Calor.   Tiene fiebre.   Aparecen nuevos sntomas.  SOLICITE AYUDA DE INMEDIATO SI:   Siente un dolor de cabeza muy intenso.  Siente dolor en el cuello.  Tiene el cuello rgido.   Vomita.   Se siente muy somnoliento.   Nota unas lneas rojas en la piel que salen de la zona afectada.   El hueso o la articulacin que se encuentran por debajo de la zona afectada le duelen despus de que la piel se haya curado.   La zona afectada se oscurece.   Tiene dificultad para respirar.    Esta informacin no tiene Marine scientist el consejo del mdico. Asegrese de hacerle al mdico cualquier pregunta que tenga.   Document Released: 12/05/2010 Document Revised: 12/28/2014 Elsevier Interactive Patient Education [employer].

## 2016-01-26 ENCOUNTER — Other Ambulatory Visit: Payer: Self-pay | Admitting: Family Medicine

## 2016-02-26 ENCOUNTER — Other Ambulatory Visit: Payer: Self-pay | Admitting: Family Medicine

## 2016-02-29 ENCOUNTER — Telehealth: Payer: Self-pay | Admitting: Family Medicine

## 2016-02-29 NOTE — Telephone Encounter (Signed)
To [facility] red team - please call pt and let her know I have refilled her pravastatin & synthroid for 30 days but she needs to schedule an appointment to follow up on her cholesterol and hypothyroidism. Has not had labs for these checked in 1.5 years and must come in before I will give her any more refills.  Thanks, Leeanne Rio, MD

## 2016-03-01 NOTE — Telephone Encounter (Signed)
Spoke with pt daughter and she was informed. Pt needs to get orange card renewed before she can make an appt. I gave her the number to make an appt for orange card renewal. Delray Alt, CMA

## 2016-04-03 ENCOUNTER — Other Ambulatory Visit: Payer: Self-pay | Admitting: Family Medicine

## 2016-05-24 ENCOUNTER — Ambulatory Visit: Payer: Self-pay

## 2016-05-31 ENCOUNTER — Ambulatory Visit (INDEPENDENT_AMBULATORY_CARE_PROVIDER_SITE_OTHER): Payer: No Typology Code available for payment source | Admitting: Family Medicine

## 2016-05-31 ENCOUNTER — Encounter: Payer: Self-pay | Admitting: Family Medicine

## 2016-05-31 VITALS — BP 130/90 | HR 64 | Temp 98.0°F | Ht 60.0 in | Wt 127.0 lb

## 2016-05-31 DIAGNOSIS — E785 Hyperlipidemia, unspecified: Secondary | ICD-10-CM

## 2016-05-31 DIAGNOSIS — Z23 Encounter for immunization: Secondary | ICD-10-CM

## 2016-05-31 DIAGNOSIS — E038 Other specified hypothyroidism: Secondary | ICD-10-CM

## 2016-05-31 DIAGNOSIS — Z0000 Encounter for general adult medical examination without abnormal findings: Secondary | ICD-10-CM

## 2016-05-31 LAB — CBC
HCT: 42.4 % (ref 35.0–45.0)
Hemoglobin: 13.9 g/dL (ref 11.7–15.5)
MCH: 28.1 pg (ref 27.0–33.0)
MCHC: 32.8 g/dL (ref 32.0–36.0)
MCV: 85.8 fL (ref 80.0–100.0)
MPV: 9.6 fL (ref 7.5–12.5)
PLATELETS: 295 10*3/uL (ref 140–400)
RBC: 4.94 MIL/uL (ref 3.80–5.10)
RDW: 15.3 % — AB (ref 11.0–15.0)
WBC: 9.7 10*3/uL (ref 3.8–10.8)

## 2016-05-31 LAB — BASIC METABOLIC PANEL
BUN: 12 mg/dL (ref 7–25)
CHLORIDE: 103 mmol/L (ref 98–110)
CO2: 28 mmol/L (ref 20–31)
Calcium: 9.9 mg/dL (ref 8.6–10.4)
Creat: 0.7 mg/dL (ref 0.50–0.99)
Glucose, Bld: 84 mg/dL (ref 65–99)
POTASSIUM: 4.8 mmol/L (ref 3.5–5.3)
Sodium: 140 mmol/L (ref 135–146)

## 2016-05-31 LAB — TSH

## 2016-05-31 LAB — LIPID PANEL
CHOL/HDL RATIO: 3.9 ratio (ref <5.0)
Cholesterol: 244 mg/dL — ABNORMAL HIGH (ref <200)
HDL: 62 mg/dL (ref >50)
LDL Cholesterol: 146 mg/dL — ABNORMAL HIGH (ref <100)
Triglycerides: 179 mg/dL — ABNORMAL HIGH (ref <150)
VLDL: 36 mg/dL — AB (ref <30)

## 2016-05-31 MED ORDER — LEVOTHYROXINE SODIUM 75 MCG PO TABS: ORAL_TABLET | ORAL | 0 refills | Status: DC

## 2016-05-31 MED ORDER — PRAVASTATIN SODIUM 20 MG PO TABS: 20.0000 mg | ORAL_TABLET | Freq: Every day | ORAL | 2 refills | Status: DC

## 2016-05-31 NOTE — Progress Notes (Signed)
   Subjective:   Patient ID: Sara Haas    DOB: 1956/01/26, 61 y.o. female   MRN: [id number]  CC: med refill   HPI: Sara Haas is a 61 y.o. female who presents to clinic today for medication refills.   She is here with her daughter.   Hypothyroidism and Hyperlipidemia Patient requested Pravastatin and Synthroid refills November 9th from PCP.   Refilled for 30 days and was advised by Dr. Ardelia Mems to come in and be seen for follow up prior to further refills as she had not had recent labs in about a year and a half.  Patient did not follow up until today (almost been 2 months) as she was waiting on orange card.  Has not been taking her meds during this time.  Otherwise doing well and reports good medication compliance.  Has been tolerating her medications well and is not symptomatic.  Denies fatigue, dry skin, cold intolerance, low energy.  Takes a daily aspirin and MV.    ROS: Denies fevers, chills, nausea, vomiting, diarrhea, shortness of breath.   Smoking status reviewed.  Patient is a never smoker.   Medications reviewed.  Objective:   BP 130/90   Pulse 64   Temp 98 F (36.7 C) (Oral)   Ht 5' (1.524 m)   Wt 127 lb (57.6 kg)   BMI 24.80 kg/m  Vitals and nursing note reviewed.  General: pleasant 61 yo female, well nourished, well developed, in no acute distress HEENT: NCAT, MMM Neck: supple CV: RRR no MRG Lungs:  Clear bilaterally with normal work of breathing Abdomen: soft, NT, ND, +bs Skin: warm, dry Extremities: warm and well perfused, normal tone  Assessment & Plan:   61 yo female presents to clinic for medication refill  HLD (hyperlipidemia) -Patient taking Pravastatin 20 mg daily.  Last Cr 0.59 and wnl but from 2016. -Lipid panel and BMET today, refilled Pravastatin -Patient to follow up in 6 weeks to recheck labs (BMET and lipid panel)  HYPOTHYROIDISM Stable. Last TSH April 2016 normal  0.47.  Patient asymptomatic and without medications for 2  months.   -Will obtain TSH today and refill 75 mcg Synthroid.   -Patient to follow up   Health Maintenance -Flu shot administered today -Prescription for Zostavax given   Orders Placed This Encounter  Procedures  . Flu Vaccine QUAD 36+ mos IM  . Lipid panel  . TSH  . CBC  . Basic Metabolic Panel  . POCT glycosylated hemoglobin (Hb A1C)   Meds ordered this encounter  Medications  . pravastatin (PRAVACHOL) 20 MG tablet    Sig: Take 1 tablet (20 mg total) by mouth at bedtime.    Dispense:  30 tablet    Refill:  2  . levothyroxine (SYNTHROID, LEVOTHROID) 75 MCG tablet    Sig: TAKE ONE TABLET BY MOUTH ONCE DAILY....Marland KitchenPATIENT NEEDS TO SCHEDULE FOLLOW UP VISIT WITH [facility] FOR FURTHER REFILLS    Dispense:  30 tablet    Refill:  0    Will not do 90 day supply. Patient must come for appointment.   Follow up: 6 weeks  Lovenia Kim, MD [place], PGY-1 06/03/2016 12:19 AM

## 2016-05-31 NOTE — Patient Instructions (Addendum)
It was very nice to meet you! You were seen in clinic today for follow up of your hypothyroidism and cholesterol.  Your medications were refilled and sent to your pharmacy.   I will follow up with you with any abnormal results.    Please follow up in 6 weeks to recheck TSH and lipid panel to make sure your doses are appropriate.

## 2016-06-03 NOTE — Assessment & Plan Note (Addendum)
-  Patient taking Pravastatin 20 mg daily.  Last Cr 0.59 and wnl but from 2016. -Lipid panel and BMET today, refilled Pravastatin -Patient to follow up in 6 weeks to recheck labs (BMET and lipid panel)

## 2016-06-03 NOTE — Assessment & Plan Note (Signed)
Stable. Last TSH April 2016 normal  0.47.  Patient asymptomatic and without medications for 2 months.   -Will obtain TSH today and refill 75 mcg Synthroid.   -Patient to follow up

## 2016-07-18 ENCOUNTER — Ambulatory Visit (INDEPENDENT_AMBULATORY_CARE_PROVIDER_SITE_OTHER): Payer: No Typology Code available for payment source | Admitting: Family Medicine

## 2016-07-18 ENCOUNTER — Encounter: Payer: Self-pay | Admitting: Family Medicine

## 2016-07-18 VITALS — BP 125/88 | HR 70 | Temp 97.9°F | Ht 60.0 in | Wt 127.4 lb

## 2016-07-18 DIAGNOSIS — E039 Hypothyroidism, unspecified: Secondary | ICD-10-CM

## 2016-07-18 DIAGNOSIS — Z114 Encounter for screening for human immunodeficiency virus [HIV]: Secondary | ICD-10-CM

## 2016-07-18 DIAGNOSIS — E785 Hyperlipidemia, unspecified: Secondary | ICD-10-CM

## 2016-07-18 DIAGNOSIS — Z1159 Encounter for screening for other viral diseases: Secondary | ICD-10-CM

## 2016-07-18 DIAGNOSIS — I10 Essential (primary) hypertension: Secondary | ICD-10-CM

## 2016-07-18 MED ORDER — LEVOTHYROXINE SODIUM 75 MCG PO TABS: ORAL_TABLET | ORAL | 2 refills | Status: DC

## 2016-07-18 MED ORDER — PRAVASTATIN SODIUM 20 MG PO TABS: 20.0000 mg | ORAL_TABLET | Freq: Every day | ORAL | 2 refills | Status: DC

## 2016-07-18 NOTE — Progress Notes (Signed)
   Subjective:   Patient ID: Sara Haas    DOB: 1955-06-05, 61 y.o. female   MRN: [id number]  CC: follow up hypothyroidism  HPI: Sara Haas is a 61 y.o. female who presents to clinic today for follow up on hypothyroidism and to discuss labs.  Patient seen at last visit and had not been taking her Synthroid for about 1 month because she had run out and did not follow up 1 month later because she had been waiting on her orange card.  Synthroid was refilled at that time and she was told to continue that dose. TSH also checked at that time and was >150.    States she has not been symptomatic.  Has not noticed hair and skin changes because has always had dry skin and uses lotion regularly.  However has been noticing more fatigue recently.  Denies weight gain, cold intolerance, changes in appetite.  States she is usually always hungry.  Denies leg swelling. Has had some mild constipation.     ROS: Denies abdominal pain, nausea, vomiting, diarrhea.    [place]: Pertinent past medical, surgical, family, and social history were reviewed and updated as appropriate. Smoking status reviewed.  Patient is a never smoker.   Medications reviewed.  Objective:   BP 125/88   Pulse 70   Temp 97.9 F (36.6 C) (Oral)   Ht 5' (1.524 m)   Wt 127 lb 6.4 oz (57.8 kg)   SpO2 95%   BMI 24.88 kg/m  Vitals and nursing note reviewed.  General: pleasant 61 yo female, well nourished, well developed, in no acute distress HEENT: NCAT, MMM Neck: supple, no JVD CV: RRR no MRG Lungs:  Clear bilaterally with comfortable work of breathing Abdomen: soft, NT, ND, +BS  Skin: warm, dry Extremities: warm and well perfused, normal tone Psych: normal mood and affect   Assessment & Plan:   HYPOTHYROIDISM Stable. Per chart review, TSH previously wnl 0.47 ~ April 2016 on 75 mcg Synthroid.   -Most recent TSH >150 on 06/03/2016 -Patient with some mild fatigue and constipation but otherwise denies symptoms.    -Will recheck TSH today and recommend continue Synthroid at current dose 75 mcg as may take several weeks for TSH level to stabilize now that she has restarted med -Patient to follow up in 6 weeks   HM: -Hepatitis C screen -HIV screen  Orders Placed This Encounter  Procedures  . Lipid panel  . Basic Metabolic Panel  . HIV antibody  . Hepatitis C antibody  . TSH   Meds ordered this encounter  Medications  . DISCONTD: pravastatin (PRAVACHOL) 20 MG tablet    Sig: Take 1 tablet (20 mg total) by mouth at bedtime.    Dispense:  30 tablet    Refill:  2  . levothyroxine (SYNTHROID, LEVOTHROID) 75 MCG tablet    Sig: TAKE ONE TABLET BY MOUTH ONCE DAILY    Dispense:  30 tablet    Refill:  2  . pravastatin (PRAVACHOL) 20 MG tablet    Sig: Take 1 tablet (20 mg total) by mouth at bedtime.    Dispense:  30 tablet    Refill:  2    Sara Kim, MD [place], PGY-1 07/21/2016 9:11 PM

## 2016-07-18 NOTE — Patient Instructions (Signed)
You were seen in clinic today for follow up of your hypothyroidism.  Your TSH, lipids and electrolytes were checked to make sure you are tolerating the medications well.  Please continue your current dose of Synthroid 75 mcg as your TSH was normal on this dose in the past.  We will recheck it in about 6 weeks.

## 2016-07-19 ENCOUNTER — Encounter: Payer: Self-pay | Admitting: Family Medicine

## 2016-07-19 LAB — BASIC METABOLIC PANEL
BUN/Creatinine Ratio: 29 — ABNORMAL HIGH (ref 12–28)
BUN: 20 mg/dL (ref 8–27)
CHLORIDE: 101 mmol/L (ref 96–106)
CO2: 21 mmol/L (ref 18–29)
Calcium: 9.8 mg/dL (ref 8.7–10.3)
Creatinine, Ser: 0.68 mg/dL (ref 0.57–1.00)
GFR, EST AFRICAN AMERICAN: 108 mL/min/{1.73_m2} (ref >59)
GFR, EST NON AFRICAN AMERICAN: 94 mL/min/{1.73_m2} (ref >59)
Glucose: 130 mg/dL — ABNORMAL HIGH (ref 65–99)
POTASSIUM: 4.5 mmol/L (ref 3.5–5.2)
SODIUM: 143 mmol/L (ref 134–144)

## 2016-07-19 LAB — LIPID PANEL
CHOL/HDL RATIO: 3.8 ratio (ref 0.0–4.4)
CHOLESTEROL TOTAL: 213 mg/dL — AB (ref 100–199)
HDL: 56 mg/dL (ref >39)
LDL CALC: 91 mg/dL (ref 0–99)
TRIGLYCERIDES: 329 mg/dL — AB (ref 0–149)
VLDL CHOLESTEROL CAL: 66 mg/dL — AB (ref 5–40)

## 2016-07-19 LAB — HIV ANTIBODY (ROUTINE TESTING W REFLEX): HIV Screen 4th Generation wRfx: NONREACTIVE

## 2016-07-19 LAB — TSH: TSH: 78.59 u[IU]/mL — ABNORMAL HIGH (ref 0.450–4.500)

## 2016-07-19 LAB — HEPATITIS C ANTIBODY: Hep C Virus Ab: 0.2 s/co ratio (ref 0.0–0.9)

## 2016-07-21 NOTE — Assessment & Plan Note (Addendum)
Stable. Per chart review, TSH previously wnl 0.47 ~ April 2016 on 75 mcg Synthroid.   -Most recent TSH >150 on 06/03/2016 -Patient with some mild fatigue and constipation but otherwise denies symptoms.  -Will recheck TSH today and recommend continue Synthroid at current dose 75 mcg as may take several weeks for TSH level to stabilize now that she has restarted med -Patient to follow up in 6 weeks

## 2016-08-30 ENCOUNTER — Ambulatory Visit (INDEPENDENT_AMBULATORY_CARE_PROVIDER_SITE_OTHER): Payer: No Typology Code available for payment source | Admitting: Internal Medicine

## 2016-08-30 ENCOUNTER — Encounter: Payer: Self-pay | Admitting: Internal Medicine

## 2016-08-30 VITALS — BP 118/62 | HR 75 | Temp 98.4°F | Ht 60.0 in | Wt 122.6 lb

## 2016-08-30 DIAGNOSIS — E039 Hypothyroidism, unspecified: Secondary | ICD-10-CM

## 2016-08-30 NOTE — Patient Instructions (Addendum)
It was nice meeting you today [name]!  I will call you if we need to make any changes in your thyroid medication (Synthroid).   Please continue taking your cholesterol medication as you have been.   We will see you back in six weeks to check your thyroid and cholesterol again. Do not eat anything at all before your appointment.   If you have any questions or concerns, please feel free to call the clinic.   Be well,  Dr. Avon Gully

## 2016-08-30 NOTE — Assessment & Plan Note (Signed)
Still reporting fatigue but asymptomatic otherwise. Taking Synthroid 58mcg as prescribed. Will repeat TSH today and make med changes accordingly.  - Repeat TSH today - Will call if adjustments necessary to Synthroid dosing - Continue Synthroid 21mcg in meantime - Will measure cholesterol at patient's f/u at her request - F/u in six weeks

## 2016-08-30 NOTE — Progress Notes (Signed)
   Subjective:   Patient: Sara Haas       Birthdate: Aug 21, 1955       MRN: [id number]      HPI  Sara Haas is a 61 y.o. female presenting for f/u of hypothyroidism.   Video interpreter used throughout encounter.   Hypothyroidism Patient last seen on 3/29 by Dr. Reesa Chew. At that visit, patient was reporting fatigue and constipation. She was taking 75 mcg Synthroid. TSH at that visit had improved from >150 to 78.5. She was instructed to continue Synthroid at current dose and f/u in six weeks.   Today, patient reports persistent fatigue but denies constipation. Denies other symptoms, such as skin changes, hair loss, or temperature intolerance. Has been taking Synthroid 54mcg daily as prescribed. Says she does not miss any doses.   Smoking status reviewed. Patient is never smoker.   Review of Systems See HPI.     Objective:  Physical Exam  Constitutional: She is oriented to person, place, and time and well-developed, well-nourished, and in no distress.  HENT:  Head: Normocephalic and atraumatic.  Eyes: Conjunctivae and EOM are normal. Right eye exhibits no discharge. Left eye exhibits no discharge.  Neck: Neck supple. No thyromegaly present.  Cardiovascular: Normal rate, regular rhythm and normal heart sounds.   No murmur heard. Pulmonary/Chest: Effort normal. No respiratory distress.  Lymphadenopathy:    She has no cervical adenopathy.  Neurological: She is alert and oriented to person, place, and time.  Skin: Skin is warm and dry.  Psychiatric: Affect and judgment normal.      Assessment & Plan:  Hypothyroidism Still reporting fatigue but asymptomatic otherwise. Taking Synthroid 65mcg as prescribed. Will repeat TSH today and make med changes accordingly.  - Repeat TSH today - Will call if adjustments necessary to Synthroid dosing - Continue Synthroid 43mcg in meantime - Will measure cholesterol at patient's f/u at her request - F/u in six weeks   Adin Hector, MD, MPH PGY-2 [facility] Pager [phone]

## 2016-08-31 LAB — TSH: TSH: 1.45 u[IU]/mL (ref 0.450–4.500)

## 2016-09-02 ENCOUNTER — Ambulatory Visit: Payer: No Typology Code available for payment source | Admitting: Family Medicine

## 2016-10-08 ENCOUNTER — Ambulatory Visit (INDEPENDENT_AMBULATORY_CARE_PROVIDER_SITE_OTHER): Payer: No Typology Code available for payment source | Admitting: Family Medicine

## 2016-10-08 ENCOUNTER — Encounter: Payer: Self-pay | Admitting: Family Medicine

## 2016-10-08 VITALS — BP 130/78 | HR 70 | Temp 98.4°F | Ht 60.0 in | Wt 121.6 lb

## 2016-10-08 DIAGNOSIS — E782 Mixed hyperlipidemia: Secondary | ICD-10-CM

## 2016-10-08 DIAGNOSIS — E039 Hypothyroidism, unspecified: Secondary | ICD-10-CM

## 2016-10-08 MED ORDER — PRAVASTATIN SODIUM 40 MG PO TABS: 40.0000 mg | ORAL_TABLET | Freq: Every day | ORAL | 2 refills | Status: DC

## 2016-10-08 MED ORDER — LEVOTHYROXINE SODIUM 75 MCG PO TABS: ORAL_TABLET | ORAL | 2 refills | Status: DC

## 2016-10-08 NOTE — Progress Notes (Signed)
   Subjective:   Sara Haas is a 61 y.o. female with a history of Asthma, hypothyroidism, bladder prolapse, HLD here for hypothyroidism follow-up  History obtained with help from video Spanish interpreter  Hypothyroidism Patient was seen 3/29 and 5/11 for hypothyroidism. Her TSH has improved from >150 to 78.5 to 1.45.  She continues to take Synthroid 75 mcg daily.  States she has not missed any doses.  Feeling well on current thyroid dose.  Fatigue is better. Denies insomnia, hair loss, skin/hair changes, changes in bowel habits, palpitations.  Hyperlipidemia Has been taking pravastatin 20mg  daily. No missed doses.  Before 05/2016 she had stopped medication for a few months.  Cholesterol had elevated significantly.  Was told that cholesterol would be rechecked today.  Denies CP, SOB, LE edema, myalgias, medications SEs.  Review of Systems:  Per HPI.   Social History: never smoker  Objective:  BP 130/78   Pulse 70   Temp 98.4 F (36.9 C) (Oral)   Ht 5' (1.524 m)   Wt 121 lb 9.6 oz (55.2 kg)   SpO2 96%   BMI 23.75 kg/m   Gen:  61 y.o. female in NAD HEENT: NCAT, MMM, EOMI, PERRL, anicteric sclerae, OP clear Neck: Supple, no LAD, no thyromegaly CV: RRR, no MRG Resp: Non-labored, CTAB, no wheezes noted Ext: WWP, no edema MSK: No obvious deformities, gait intact Neuro: Alert and oriented, speech normal       Chemistry      Component Value Date/Time   NA 143 07/18/2016 1639   K 4.5 07/18/2016 1639   CL 101 07/18/2016 1639   CO2 21 07/18/2016 1639   BUN 20 07/18/2016 1639   CREATININE 0.68 07/18/2016 1639   CREATININE 0.70 05/31/2016 1118      Component Value Date/Time   CALCIUM 9.8 07/18/2016 1639   ALKPHOS 82 08/10/2014 1621   AST 21 08/10/2014 1621   ALT 15 08/10/2014 1621   BILITOT 0.5 08/10/2014 1621      Lab Results  Component Value Date   WBC 9.7 05/31/2016   HGB 13.9 05/31/2016   HCT 42.4 05/31/2016   MCV 85.8 05/31/2016   PLT 295 05/31/2016    Lab Results  Component Value Date   TSH 1.450 08/30/2016   No results found for: HGBA1C Assessment & Plan:     Sara Haas is a 61 y.o. female here for   Hypothyroidism Asymptomatic, feeling well Taking Synthroid 75 g daily as prescribed Last TSH within normal limits We will recheck again today and if it is again normal would not recheck for another 6-12 months We'll adjust Synthroid dose if necessary pending TSH  HLD (hyperlipidemia) Patient taking pravastatin 20 mg daily Prior to resuming pravastatin, ASCVD 10 year risk score of 4.9% I will increase her pravastatin to 40 mg daily today Recheck direct LDL Could consider rechecking in 3 months since doses been changed, but otherwise will go back to annual screening of lipid panel   Sara Haas, Dionne Bucy, MD MPH PGY-3,  [facility] 10/08/2016  9:26 AM

## 2016-10-08 NOTE — Patient Instructions (Addendum)
Nice to meet you.  Increase Pravastatin to 40mg  daily.  Continue Synthroid 75 mcg daily.  We are checking some labs today and someone will call you or send you a letter with results when they are available.  Take care, Dr. Jacinto Reap

## 2016-10-08 NOTE — Assessment & Plan Note (Signed)
Asymptomatic, feeling well Taking Synthroid 75 g daily as prescribed Last TSH within normal limits We will recheck again today and if it is again normal would not recheck for another 6-12 months We'll adjust Synthroid dose if necessary pending TSH

## 2016-10-08 NOTE — Assessment & Plan Note (Signed)
Patient taking pravastatin 20 mg daily Prior to resuming pravastatin, ASCVD 10 year risk score of 4.9% I will increase her pravastatin to 40 mg daily today Recheck direct LDL Could consider rechecking in 3 months since doses been changed, but otherwise will go back to annual screening of lipid panel

## 2016-10-09 LAB — TSH: TSH: 0.898 u[IU]/mL (ref 0.450–4.500)

## 2016-10-09 LAB — LDL CHOLESTEROL, DIRECT: LDL Direct: 80 mg/dL (ref 0–99)

## 2016-10-10 ENCOUNTER — Encounter: Payer: Self-pay | Admitting: Family Medicine

## 2016-10-15 ENCOUNTER — Ambulatory Visit: Payer: No Typology Code available for payment source | Admitting: Family Medicine

## 2016-11-11 ENCOUNTER — Other Ambulatory Visit: Payer: Self-pay | Admitting: Family Medicine

## 2017-01-06 ENCOUNTER — Other Ambulatory Visit: Payer: Self-pay | Admitting: Family Medicine

## 2017-04-08 ENCOUNTER — Other Ambulatory Visit: Payer: Self-pay | Admitting: Family Medicine

## 2017-06-03 ENCOUNTER — Telehealth: Payer: Self-pay

## 2017-06-03 NOTE — Telephone Encounter (Signed)
Received message on nurse line from [facility] that Levothyroxine from manufacturer [employer] is backordered. They need permission to change med to manufacturer [employer]. Same dose, same directions. Danley Danker, RN [facility] RN)

## 2017-06-03 NOTE — Telephone Encounter (Signed)
Please give verbal orders for this.   

## 2017-06-03 NOTE — Telephone Encounter (Signed)
Done. Pharmacy is only going to make this change for this one refill. Will keep the original Rx as is. Should be able to fill original Rx next month. Ottis Stain, CMA

## 2017-07-08 ENCOUNTER — Other Ambulatory Visit: Payer: Self-pay | Admitting: Family Medicine

## 2017-10-07 ENCOUNTER — Other Ambulatory Visit: Payer: Self-pay | Admitting: Family Medicine

## 2017-11-07 ENCOUNTER — Emergency Department (HOSPITAL_COMMUNITY): Payer: Self-pay

## 2017-11-07 ENCOUNTER — Other Ambulatory Visit: Payer: Self-pay

## 2017-11-07 ENCOUNTER — Other Ambulatory Visit: Payer: Self-pay | Admitting: Family Medicine

## 2017-11-07 ENCOUNTER — Emergency Department (HOSPITAL_COMMUNITY)
Admission: EM | Admit: 2017-11-07 | Discharge: 2017-11-07 | Disposition: A | Discharge status: Home / Self Care | Payer: Self-pay | Attending: Emergency Medicine | Admitting: Emergency Medicine

## 2017-11-07 DIAGNOSIS — Z79899 Other long term (current) drug therapy: Secondary | ICD-10-CM | POA: Insufficient documentation

## 2017-11-07 DIAGNOSIS — J45909 Unspecified asthma, uncomplicated: Secondary | ICD-10-CM | POA: Insufficient documentation

## 2017-11-07 DIAGNOSIS — R51 Headache: Secondary | ICD-10-CM | POA: Insufficient documentation

## 2017-11-07 DIAGNOSIS — R519 Headache, unspecified: Secondary | ICD-10-CM

## 2017-11-07 DIAGNOSIS — Z7982 Long term (current) use of aspirin: Secondary | ICD-10-CM | POA: Insufficient documentation

## 2017-11-07 DIAGNOSIS — E039 Hypothyroidism, unspecified: Secondary | ICD-10-CM | POA: Insufficient documentation

## 2017-11-07 DIAGNOSIS — I726 Aneurysm of vertebral artery: Secondary | ICD-10-CM | POA: Insufficient documentation

## 2017-11-07 LAB — I-STAT TROPONIN, ED: Troponin i, poc: 0 ng/mL (ref 0.00–0.08)

## 2017-11-07 LAB — COMPREHENSIVE METABOLIC PANEL
ALK PHOS: 87 U/L (ref 38–126)
ALT: 15 U/L (ref 0–44)
ANION GAP: 10 (ref 5–15)
AST: 18 U/L (ref 15–41)
Albumin: 3.5 g/dL (ref 3.5–5.0)
BUN: 23 mg/dL (ref 8–23)
CALCIUM: 8.9 mg/dL (ref 8.9–10.3)
CO2: 24 mmol/L (ref 22–32)
Chloride: 108 mmol/L (ref 98–111)
Creatinine, Ser: 0.74 mg/dL (ref 0.44–1.00)
GFR calc non Af Amer: >60 mL/min (ref >60)
Glucose, Bld: 118 mg/dL — ABNORMAL HIGH (ref 70–99)
Potassium: 3.7 mmol/L (ref 3.5–5.1)
SODIUM: 142 mmol/L (ref 135–145)
TOTAL PROTEIN: 7.2 g/dL (ref 6.5–8.1)
Total Bilirubin: 0.4 mg/dL (ref 0.3–1.2)

## 2017-11-07 LAB — CBC
HEMATOCRIT: 40.6 % (ref 36.0–46.0)
Hemoglobin: 12.7 g/dL (ref 12.0–15.0)
MCH: 28.2 pg (ref 26.0–34.0)
MCHC: 31.3 g/dL (ref 30.0–36.0)
MCV: 90.2 fL (ref 78.0–100.0)
PLATELETS: 240 10*3/uL (ref 150–400)
RBC: 4.5 MIL/uL (ref 3.87–5.11)
RDW: 13.2 % (ref 11.5–15.5)
WBC: 11.1 10*3/uL — ABNORMAL HIGH (ref 4.0–10.5)

## 2017-11-07 LAB — CBG MONITORING, ED: GLUCOSE-CAPILLARY: 116 mg/dL — AB (ref 70–99)

## 2017-11-07 LAB — DIFFERENTIAL
Abs Immature Granulocytes: 0 10*3/uL (ref 0.0–0.1)
BASOS ABS: 0.1 10*3/uL (ref 0.0–0.1)
Basophils Relative: 1 %
EOS ABS: 0.2 10*3/uL (ref 0.0–0.7)
EOS PCT: 2 %
Immature Granulocytes: 0 %
LYMPHS PCT: 36 %
Lymphs Abs: 4 10*3/uL (ref 0.7–4.0)
Monocytes Absolute: 1 10*3/uL (ref 0.1–1.0)
Monocytes Relative: 9 %
Neutro Abs: 5.9 10*3/uL (ref 1.7–7.7)
Neutrophils Relative %: 52 %

## 2017-11-07 LAB — CSF CELL COUNT WITH DIFFERENTIAL
RBC COUNT CSF: 9 /mm3 — AB
RBC Count, CSF: 117 /mm3 — ABNORMAL HIGH
TUBE #: 4
Tube #: 1
WBC CSF: 5 /mm3 (ref 0–5)
WBC, CSF: 5 /mm3 (ref 0–5)

## 2017-11-07 LAB — I-STAT CHEM 8, ED
BUN: 25 mg/dL — ABNORMAL HIGH (ref 8–23)
Calcium, Ion: 1.1 mmol/L — ABNORMAL LOW (ref 1.15–1.40)
Chloride: 107 mmol/L (ref 98–111)
Creatinine, Ser: 0.7 mg/dL (ref 0.44–1.00)
GLUCOSE: 114 mg/dL — AB (ref 70–99)
HCT: 40 % (ref 36.0–46.0)
HEMOGLOBIN: 13.6 g/dL (ref 12.0–15.0)
Potassium: 3.7 mmol/L (ref 3.5–5.1)
Sodium: 142 mmol/L (ref 135–145)
TCO2: 24 mmol/L (ref 22–32)

## 2017-11-07 LAB — GLUCOSE, CSF: GLUCOSE CSF: 55 mg/dL (ref 40–70)

## 2017-11-07 LAB — APTT: APTT: 23 s — AB (ref 24–36)

## 2017-11-07 LAB — PROTEIN, CSF: Total  Protein, CSF: 40 mg/dL (ref 15–45)

## 2017-11-07 LAB — PROTIME-INR
INR: 0.95
PROTHROMBIN TIME: 12.6 s (ref 11.4–15.2)

## 2017-11-07 MED ORDER — DIPHENHYDRAMINE HCL 50 MG/ML IJ SOLN
25.0000 mg | Freq: Once | INTRAMUSCULAR | Status: AC
  Administered 2017-11-07: 25 mg via INTRAVENOUS
  Filled 2017-11-07: qty 1

## 2017-11-07 MED ORDER — IOPAMIDOL (ISOVUE-370) INJECTION 76%
50.0000 mL | Freq: Once | INTRAVENOUS | Status: AC | PRN
  Administered 2017-11-07: 50 mL via INTRAVENOUS

## 2017-11-07 MED ORDER — TRAMADOL HCL 50 MG PO TABS: 50.0000 mg | ORAL_TABLET | Freq: Four times a day (QID) | ORAL | 0 refills | Status: DC | PRN

## 2017-11-07 MED ORDER — LIDOCAINE HCL 2 % IJ SOLN
20.0000 mL | Freq: Once | INTRAMUSCULAR | Status: DC
  Filled 2017-11-07: qty 20

## 2017-11-07 MED ORDER — PROMETHAZINE HCL 25 MG PO TABS: 25.0000 mg | ORAL_TABLET | Freq: Four times a day (QID) | ORAL | 0 refills | Status: DC | PRN

## 2017-11-07 MED ORDER — PROCHLORPERAZINE EDISYLATE 10 MG/2ML IJ SOLN
10.0000 mg | Freq: Once | INTRAMUSCULAR | Status: AC
  Administered 2017-11-07: 10 mg via INTRAVENOUS
  Filled 2017-11-07: qty 2

## 2017-11-07 NOTE — ED Notes (Signed)
Family at bedside. 

## 2017-11-07 NOTE — ED Notes (Signed)
Patient transported to CT 

## 2017-11-07 NOTE — Discharge Instructions (Signed)
Schedule follow-up with your primary doctor as well as one of the listed neurology groups to further evaluate your headaches.

## 2017-11-07 NOTE — ED Notes (Signed)
Pt CBG was 116, notified Jessica(RN)

## 2017-11-07 NOTE — ED Notes (Signed)
Patient is resting comfortably. 

## 2017-11-07 NOTE — ED Provider Notes (Addendum)
[facility] EMERGENCY DEPARTMENT Provider Note   CSN: [id number] Arrival date & time: 11/07/17  0127     History   Chief Complaint Chief Complaint  Patient presents with  . Headache    HPI Sara Haas is a 62 y.o. female.  Patient presents to the emergency department for evaluation of headache.  Patient reports that her headache started 4 days ago.  She was visiting family in [place] at that time.  She did have a fall while she was in [place], but the headache was present prior to the fall.  She did not seek any medical care, came back to [facility] yesterday.  She had been taking some pain medication with improvement of her headache, however tonight the headache acutely worsened at 12:30 AM.  Patient reports that the pain is severe.  She has not noticed any photosensitivity, nausea, vomiting, numbness, tingling or weakness of extremities.     Past Medical History:  Diagnosis Date  . Aneurysm ([place])   . Asthma   . Hyperlipidemia   . Thyroid disease    hypothyroidism    Patient Active Problem List   Diagnosis Date Noted  . Eye pain 12/15/2014  . Left-sided weakness   . 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 Facial droop 07/23/2014  . Preventative health care 01/14/2014  . Solitary pulmonary nodule 07/20/2013  . Rectocele 05/18/2013  . Abdominal hernia 12/18/2012  . Female bladder prolapse, acquired 10/21/2012  . Mixed incontinence 10/21/2012  . HLD (hyperlipidemia) 03/26/2012  . Asthma 03/25/2012  . MENOPAUSE, SURGICAL 12/10/2006  . Hypothyroidism 12/09/2006    Past Surgical History:  Procedure Laterality Date  . ABDOMINAL HYSTERECTOMY    . ANTERIOR AND POSTERIOR REPAIR N/A 05/18/2013   Procedure:  REPAIR OF RECTOCELE WITH GRAFT,REPAIR OF ENTEROCELE ;  Surgeon: Reece Packer, MD;  Location: [facility];  Service: Urology;  Laterality: N/A;  . BLADDER SURGERY    . CYSTOCELE REPAIR  2009  . CYSTOSCOPY N/A 05/18/2013   Procedure: CYSTOSCOPY;  Surgeon: Reece Packer, MD;  Location: [facility];  Service: Urology;  Laterality: N/A;  . THYROIDECTOMY    . VAGINAL PROLAPSE REPAIR N/A 05/18/2013   Procedure:  VAULT PROLAPSE ;  Surgeon: Reece Packer, MD;  Location: [facility];  Service: Urology;  Laterality: N/A;     OB History    Gravida  5   Para  4   Term  4   Preterm  0   AB  1   Living  4     SAB  1   TAB  0   Ectopic  0   Multiple  0   Live Births               Home Medications    Prior to Admission medications   Medication Sig Start Date End Date Taking? Authorizing Provider  aspirin 81 MG chewable tablet Chew 4 tablets (324 mg total) by mouth daily. Patient taking differently: Chew 81 mg by mouth daily.  08/15/14  Yes Haney, Alyssa A, MD  diphenhydrAMINE (BENADRYL) 25 MG tablet Take 1 tablet (25 mg total) by mouth every 6 (six) hours as needed for itching. 12/22/15  Yes Luiz Blare Y, DO  ibuprofen (ADVIL,MOTRIN) 200 MG tablet Take 600 mg by mouth every 6 (six) hours as needed for headache.   Yes [provider]  levothyroxine (SYNTHROID, LEVOTHROID) 75 MCG tablet TAKE 1 TABLET BY MOUTH ONCE DAILY 10/08/17  Yes Esmond Camper  H, MD  pravastatin (PRAVACHOL) 20 MG tablet TAKE 1 TABLET BY MOUTH ONCE DAILY AT BEDTIME 10/08/17  Yes Alveda Reasons, MD  clotrimazole-betamethasone (LOTRISONE) cream Apply 1 application topically 2 (two) times daily. Patient not taking: Reported on 11/07/2017 12/22/15   Katheren Shams, DO  pravastatin (PRAVACHOL) 20 MG tablet TAKE 1 TABLET BY MOUTH AT BEDTIME Patient not taking: Reported on 11/07/2017 11/11/16   Alveda Reasons, MD  pravastatin (PRAVACHOL) 40 MG tablet Take 1 tablet (40 mg total) by mouth at bedtime. Patient not taking: Reported on 11/07/2017 10/08/16   Virginia Crews, MD    Family History Family History  Problem Relation Age of Onset  . Heart disease Mother   . Asthma Mother   . Stroke Father   . Parkinson's disease  Father   . Heart disease Sister   . Heart disease Brother     Social History Social History   Tobacco Use  . Smoking status: Never Smoker  . Smokeless tobacco: Never Used  Substance Use Topics  . Alcohol use: No    Comment: social  . Drug use: No     Allergies   Patient has no known allergies.   Review of Systems Review of Systems  Neurological: Positive for headaches.  All other systems reviewed and are negative.    Physical Exam Updated Vital Signs BP 112/73 (BP Location: Right Arm)   Pulse 67   Temp 99.7 F (37.6 C) (Oral)   Resp 20   Ht 4\' 11"  (1.499 m)   Wt 54.4 kg (120 lb)   SpO2 97%   BMI 24.24 kg/m   Physical Exam  Constitutional: She is oriented to person, place, and time. She appears well-developed and well-nourished. She appears distressed.  HENT:  Head: Normocephalic and atraumatic.  Right Ear: Hearing normal.  Left Ear: Hearing normal.  Nose: Nose normal.  Mouth/Throat: Oropharynx is clear and moist and mucous membranes are normal.  Eyes: Pupils are equal, round, and reactive to light. Conjunctivae and EOM are normal.  Neck: Normal range of motion. Neck supple.  Cardiovascular: Regular rhythm, S1 normal and S2 normal. Exam reveals no gallop and no friction rub.  No murmur heard. Pulmonary/Chest: Effort normal and breath sounds normal. No respiratory distress. She exhibits no tenderness.  Abdominal: Soft. Normal appearance and bowel sounds are normal. There is no hepatosplenomegaly. There is no tenderness. There is no rebound, no guarding, no tenderness at McBurney's point and negative Murphy's sign. No hernia.  Musculoskeletal: Normal range of motion.  Neurological: She is alert and oriented to person, place, and time. She has normal strength. No cranial nerve deficit or sensory deficit. Coordination normal. GCS eye subscore is 4. GCS verbal subscore is 5. GCS motor subscore is 6.  Skin: Skin is warm, dry and intact. No rash noted. No cyanosis.    Psychiatric: She has a normal mood and affect. Her speech is normal and behavior is normal. Thought content normal.  Nursing note and vitals reviewed.    ED Treatments / Results  Labs (all labs ordered are listed, but only abnormal results are displayed) Labs Reviewed  APTT - Abnormal; Notable for the following components:      Result Value   aPTT 23 (*)    All other components within normal limits  CBC - Abnormal; Notable for the following components:   WBC 11.1 (*)    All other components within normal limits  COMPREHENSIVE METABOLIC PANEL - Abnormal; Notable for the following  components:   Glucose, Bld 118 (*)    All other components within normal limits  CBG MONITORING, ED - Abnormal; Notable for the following components:   Glucose-Capillary 116 (*)    All other components within normal limits  I-STAT CHEM 8, ED - Abnormal; Notable for the following components:   BUN 25 (*)    Glucose, Bld 114 (*)    Calcium, Ion 1.10 (*)    All other components within normal limits  CSF CULTURE  GRAM STAIN  PROTIME-INR  DIFFERENTIAL  CSF CELL COUNT WITH DIFFERENTIAL  CSF CELL COUNT WITH DIFFERENTIAL  GLUCOSE, CSF  PROTEIN, CSF  I-STAT TROPONIN, ED    EKG EKG Interpretation  Date/Time:  Friday November 07 2017 01:51:57 EDT Ventricular Rate:  80 PR Interval:    QRS Duration: 59 QT Interval:  501 QTC Calculation: 579 R Axis:     Text Interpretation:  Sinus rhythm Low voltage, precordial leads Borderline T abnormalities, anterior leads Prolonged QT interval Confirmed by Orpah Greek [phone]) on 11/07/2017 2:12:21 AM   Radiology Ct Angio Head W Or Wo Contrast  Result Date: 11/07/2017 CLINICAL DATA:  62 y/o  F; worst headache of life. EXAM: CT ANGIOGRAPHY HEAD AND NECK TECHNIQUE: Multidetector CT imaging of the head and neck was performed using the standard protocol during bolus administration of intravenous contrast. Multiplanar CT image reconstructions and MIPs were  obtained to evaluate the vascular anatomy. Carotid stenosis measurements (when applicable) are obtained utilizing NASCET criteria, using the distal internal carotid diameter as the denominator. CONTRAST:  60mL ISOVUE-370 IOPAMIDOL (ISOVUE-370) INJECTION 76% COMPARISON:  11/07/2017 CT head. 01/13/2015 CT head. 08/11/2014 cerebral angiogram. FINDINGS: CTA NECK FINDINGS Aortic arch: Standard branching. Imaged portion shows no evidence of aneurysm or dissection. No significant stenosis of the major arch vessel origins. Mild calcific atherosclerosis. Right carotid system: No evidence of dissection, stenosis (50% or greater) or occlusion. Left carotid system: No evidence of dissection, stenosis (50% or greater) or occlusion. Vertebral arteries: Right vertebral artery aneurysm is stable in size measuring 13 x 12 x 13 mm (AP x ML x CC series 9, image 118 and series 8, image 131). The aneurysm is peripherally calcified with thick mural thrombus. Skeleton: Negative. Other neck: Negative. Upper chest: Negative. Review of the MIP images confirms the above findings CTA HEAD FINDINGS Anterior circulation: No significant stenosis, proximal occlusion, aneurysm, or vascular malformation. Partially azygos anterior cerebral artery system. Posterior circulation: Right vertebral artery aneurysm is stable in size measuring 13 x 12 x 13 mm (AP x ML x CC series 9, image 118 and series 8, image 131). The aneurysm is peripherally calcified with thick mural thrombus. No large vessel occlusion, high-grade stenosis, or vascular malformation. Venous sinuses: As permitted by contrast timing, patent. Anatomic variants: None significant. Delayed phase: No abnormal intracranial enhancement. Review of the MIP images confirms the above findings IMPRESSION: 1. 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 2. No large vessel occlusion, high-grade stenosis, dissection, vascular malformation, or new aneurysm identified. Electronically  Signed   By: Kristine Garbe M.D.   On: 11/07/2017 03:04   Ct Head Wo Contrast  Result Date: 11/07/2017 CLINICAL DATA:  Worst headache of life EXAM: CT HEAD WITHOUT CONTRAST TECHNIQUE: Contiguous axial images were obtained from the base of the skull through the vertex without intravenous contrast. COMPARISON:  [facility] dating back through 09/10/2010 FINDINGS: Brain: Atrophy with chronic small vessel ischemia. No acute intracranial hemorrhage, midline shift or edema. No hydrocephalus. No intra-axial mass  nor extra-axial fluid collections. Midline fourth ventricle and basal cisterns without effacement. Brainstem and cerebellum are normal. Vascular: Calcified 13 mm distal left vertebral artery aneurysm, previously stated as right vertebral artery but is the left as mentioned on prior exams dating back through 2012. Skull: No acute skull fracture. Sinuses/Orbits: Mild ethmoid sinus mucosal thickening. No air-fluid levels. Clear mastoids. Intact orbits and globes. 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 Chronic small vessel ischemic disease. No acute intracranial abnormality. Electronically Signed   By: Ashley Royalty M.D.   On: 11/07/2017 02:04   Ct Angio Neck W And/or Wo Contrast  Result Date: 11/07/2017 CLINICAL DATA:  62 y/o  F; worst headache of life. EXAM: CT ANGIOGRAPHY HEAD AND NECK TECHNIQUE: Multidetector CT imaging of the head and neck was performed using the standard protocol during bolus administration of intravenous contrast. Multiplanar CT image reconstructions and MIPs were obtained to evaluate the vascular anatomy. Carotid stenosis measurements (when applicable) are obtained utilizing NASCET criteria, using the distal internal carotid diameter as the denominator. CONTRAST:  59mL ISOVUE-370 IOPAMIDOL (ISOVUE-370) INJECTION 76% COMPARISON:  11/07/2017 CT head. 01/13/2015 CT head. 08/11/2014 cerebral angiogram. FINDINGS: CTA NECK FINDINGS Aortic  arch: Standard branching. Imaged portion shows no evidence of aneurysm or dissection. No significant stenosis of the major arch vessel origins. Mild calcific atherosclerosis. Right carotid system: No evidence of dissection, stenosis (50% or greater) or occlusion. Left carotid system: No evidence of dissection, stenosis (50% or greater) or occlusion. Vertebral arteries: Right vertebral artery aneurysm is stable in size measuring 13 x 12 x 13 mm (AP x ML x CC series 9, image 118 and series 8, image 131). The aneurysm is peripherally calcified with thick mural thrombus. Skeleton: Negative. Other neck: Negative. Upper chest: Negative. Review of the MIP images confirms the above findings CTA HEAD FINDINGS Anterior circulation: No significant stenosis, proximal occlusion, aneurysm, or vascular malformation. Partially azygos anterior cerebral artery system. Posterior circulation: Right vertebral artery aneurysm is stable in size measuring 13 x 12 x 13 mm (AP x ML x CC series 9, image 118 and series 8, image 131). The aneurysm is peripherally calcified with thick mural thrombus. No large vessel occlusion, high-grade stenosis, or vascular malformation. Venous sinuses: As permitted by contrast timing, patent. Anatomic variants: None significant. Delayed phase: No abnormal intracranial enhancement. Review of the MIP images confirms the above findings IMPRESSION: 1. 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 2. No large vessel occlusion, high-grade stenosis, dissection, vascular malformation, or new aneurysm identified. Electronically Signed   By: Kristine Garbe M.D.   On: 11/07/2017 03:04    Procedures .Lumbar Puncture Date/Time: 11/07/2017 6:30 AM Performed by: Orpah Greek, MD Authorized by: Orpah Greek, MD   Consent:    Consent obtained:  Written   Consent given by:  Patient   Risks discussed:  Bleeding, infection, pain, repeat procedure and  headache Universal protocol:    Procedure explained and questions answered to patient or proxy's satisfaction: yes     Relevant documents present and verified: yes     Test results available and properly labeled: yes     Imaging studies available: yes     Required blood products, implants, devices, and special equipment available: yes     Immediately prior to procedure a time out was called: yes     Site/side marked: yes     Patient identity confirmed:  Hospital-assigned identification number Pre-procedure details:    Procedure purpose:  Diagnostic   Preparation: Patient was prepped and draped in usual sterile fashion   Anesthesia (see MAR for exact dosages):    Anesthesia method:  Local infiltration   Local anesthetic:  Lidocaine 2% w/o epi Procedure details:    Lumbar space:  L3-L4 interspace   Patient position:  Sitting   Needle gauge:  20   Needle type:  Diamond point   Needle length (in):  3.5   Ultrasound guidance: no     Number of attempts:  2   Fluid appearance:  Clear   Tubes of fluid:  4   Total volume (ml):  4 Post-procedure:    Puncture site:  Adhesive bandage applied   (including critical care time)  Medications Ordered in ED Medications  lidocaine (XYLOCAINE) 2 % (with pres) injection 400 mg (has no administration in time range)  prochlorperazine (COMPAZINE) injection 10 mg (10 mg Intravenous Given 11/07/17 0222)  diphenhydrAMINE (BENADRYL) injection 25 mg (25 mg Intravenous Given 11/07/17 0222)  iopamidol (ISOVUE-370) 76 % injection 50 mL (50 mLs Intravenous Contrast Given 11/07/17 0242)     Initial Impression / Assessment and Plan / ED Course  I have reviewed the triage vital signs and the nursing notes.  Pertinent labs & imaging results that were available during my care of the patient were reviewed by me and considered in my medical decision making (see chart for details).     Presents to the emergency department for evaluation of headache.  She has been  having ongoing headache for the past 4 days.  Patient underwent CT scan which was ordered through triage protocol.  This was negative.  When I evaluated the patient she was still having a severe headache.  She reports that it is worse than the headaches she has had in the past.  Reviewing records reveals that she has a known vertebral artery aneurysm.  She was sent back to radiology to have a CT angiography of head and neck.  Aneurysm is unchanged, no obvious evidence of bleeding.  Patient administered IV Compazine and Benadryl for possible migraine headache.  I discussed the findings of the CAT scan with patient and family.  Based on her known aneurysm and the fact that the headache has been ongoing for 4 days, I recommended a lumbar puncture.  Lumbar puncture has been performed.  Gross evaluation of the CSF was unremarkable, no gross blood, no xanthochromia.  Protein and glucose are normal.  There is no concern for infection.  Red blood cell count on two-point was 117, red blood cell count on tube was 9.  This is consistent with clearing.  There is no xanthochromia.  This is not consistent with subarachnoid hemorrhage.  She will be discharged, recommended she does not use NSAIDs for headaches.  Given a prescription for Phenergan, Ultram for refractory headaches and she will follow-up with primary care and neurology.  Final Clinical Impressions(s) / ED Diagnoses   Final diagnoses:  Bad headache    ED Discharge Orders    None       Pollina, Gwenyth Allegra, MD 11/07/17 2263    Orpah Greek, MD 11/07/17 [phone]

## 2017-11-07 NOTE — ED Notes (Signed)
Pt in room  Family @ bedside VS documented Call light within reach

## 2017-11-07 NOTE — ED Notes (Signed)
[phone] Janett Billow (Daughter)

## 2017-11-07 NOTE — ED Triage Notes (Signed)
Patient c/o worst headache in life; uncontrollably crying. Has known aneurysm that per daughter is stable.

## 2017-11-10 LAB — CSF CULTURE: CULTURE: NO GROWTH

## 2017-11-10 LAB — CSF CULTURE W GRAM STAIN

## 2017-11-19 ENCOUNTER — Ambulatory Visit: Payer: Self-pay

## 2017-11-21 ENCOUNTER — Other Ambulatory Visit: Payer: Self-pay

## 2017-11-21 ENCOUNTER — Encounter: Payer: Self-pay | Admitting: Family Medicine

## 2017-11-21 ENCOUNTER — Ambulatory Visit (INDEPENDENT_AMBULATORY_CARE_PROVIDER_SITE_OTHER): Payer: Self-pay | Admitting: Family Medicine

## 2017-11-21 VITALS — BP 110/70 | HR 76 | Temp 98.6°F | Ht 60.0 in | Wt 124.0 lb

## 2017-11-21 DIAGNOSIS — E782 Mixed hyperlipidemia: Secondary | ICD-10-CM

## 2017-11-21 DIAGNOSIS — E039 Hypothyroidism, unspecified: Secondary | ICD-10-CM

## 2017-11-21 DIAGNOSIS — M542 Cervicalgia: Secondary | ICD-10-CM | POA: Insufficient documentation

## 2017-11-21 MED ORDER — AMITRIPTYLINE HCL 25 MG PO TABS: 25.0000 mg | ORAL_TABLET | Freq: Every day | ORAL | 2 refills | Status: DC

## 2017-11-21 MED ORDER — PRAVASTATIN SODIUM 20 MG PO TABS: 20.0000 mg | ORAL_TABLET | Freq: Every day | ORAL | 3 refills | Status: DC

## 2017-11-21 MED ORDER — LEVOTHYROXINE SODIUM 75 MCG PO TABS: 75.0000 ug | ORAL_TABLET | Freq: Every day | ORAL | 3 refills | Status: DC

## 2017-11-21 MED ORDER — AMITRIPTYLINE HCL 10 MG PO TABS: 25.0000 mg | ORAL_TABLET | Freq: Every day | ORAL | Status: DC

## 2017-11-21 NOTE — Assessment & Plan Note (Signed)
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  Differential also includes cervicogenic headache.  Doubt intracranial process as CT scan, CTA head and neck, and Lumbar punture in ED are all negative and patient has had no further headaches, only neck pain, and is neurologically intact.  Suspect stress is a factor in this patient's symptoms, as she was in a stressful situation at the hospital with her brother when onset of first headache occurred. -Start amitriptyline 25mg  QHS -Recheck in 3 weeks -Patient educated on red flag symptoms, including headache that will not go away, changes in balance, changes in vision, and was advised to go to the ED if these occur -Patient encouraged to call the clinic if symptoms do not improve -consider Neurology referral if symptoms do not resolve, not indicated at this time due to negative imaging workup, negative lumbar puncture, and patient is neurologically intact

## 2017-11-21 NOTE — Patient Instructions (Addendum)
Thank you for coming in today.  It was great to meet you!  I'm sorry that you are having neck pain and feeling very weak and tired.  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  It causes the back of your head and neck to hurt and can be very tender when you touch the back of your head.  I will send you in a prescription for Amitriptyline which you should take every night.  Please come back to the clinic in 3 weeks to see how you are doing.    If this does not help, please let us know!  If you pain gets much worse, you start feeling dizzy, your vision changes, you should go to the Emergency Room.  I think your weakness and fatigue may be related to your thyroid.  We will recheck your levels and see if we need to make adjustments on your medications.  It was a pleasure to meet you!  I hope that you feel better soon! Best,  Dr. Sandi Carne

## 2017-11-21 NOTE — Assessment & Plan Note (Addendum)
Patient on Pravastatin 20mg  QD, is compliant with medications. 10-year ASCVD risk 3.9%. -Lipid Panel today -cont Pravastatin 20mg  QD -repeat Lipid panel in 1 year

## 2017-11-21 NOTE — Assessment & Plan Note (Signed)
Suspect fatigue due to hypothyroidism, as last TSH was 10/08/16.   - TSH today - cont Synthroid 63mcg QD, will consider adjusting dose pending TSH level

## 2017-11-21 NOTE — Progress Notes (Signed)
Subjective: Chief Complaint  Patient presents with  . Thyroid Problem  . Headache    went to ED  . referral to neurologist     HPI: Sara Haas is a 62 y.o. presenting to clinic today to discuss the following:  1 Headache/Neck Pain Patient states that on 7/15 when she was visiting her brother who was in the hospital in [place]., she experienced a sudden 10/10 headache that was all over her head and was so painful it caused her to fall to the ground.  She did not seek medical attention at that time.  On 7/19, patient again experienced a headache that was all over her head and 10/10 throbbing pain.  She went to the ED where she had a head CT, CTA head, CTA neck, and lumbar puncture.  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  There was no sign of acute bleed, Lumbar Puncture was negative.  Patient was discahrged from the ED with tramadol and phenergen.  She states that since 7/19, she has not had a headche, but her neck has been sore and stiff, which causes her pain.  She has used Tramadol and ice packs without relief.  She states that the pain is worse in the mornings when her neck is very stiff, but improves as she moves more throughout the day.  She also notices that the pain is worse at night when she lays down.  Since onset of these symptoms, patient has also noted a feeling of overall weakness ad fatigue.  Her daughter states that she has not been her usual self and has just been laying around the house, when she typically is very active.  She denies associated vision changes, gait instability, dizziness, sensory changes.  The patient notes that she is very concerned about her condition and is worried that "she won't be okay."   2 Hypothyroidism Current symptoms: change in energy level . Patient has been more fatigued and feels overall weak.  Patient denies diarrhea, heat / cold intolerance and weight changes.  Last TSH  0.898 on 10/08/16.  Patient also noted a rash on her neck a few weeks ago that has begun to resolve.  She is currently taking Synthroid 79mcg daily and notes that she is compliant with this medication.  3 Hyperlipidemia Symptoms Chest pain on exertion: Denies Leg claudication: Denies Medications (modifying factor): Pravastatin 20mg   Compliance- good Right upper quadrant pain- None            Muscle aches- None  Lipid Panel     Component Value Date/Time   CHOL 213 (H) 07/18/2016 1639   TRIG 329 (H) 07/18/2016 1639   HDL 56 07/18/2016 1639   CHOLHDL 3.8 07/18/2016 1639   CHOLHDL 3.9 05/31/2016 1118   VLDL 36 (H) 05/31/2016 1118   LDLCALC 91 07/18/2016 1639   LDLDIRECT 80 10/08/2016 0937     Health Maintenance: Not addressed     ROS noted in HPI.   Past Medical, Surgical, Social, and Family History Reviewed & Updated per EMR.   Pertinent Historical Findings include:   Social History   Tobacco Use  Smoking Status Never Smoker  Smokeless Tobacco Never Used      Objective: BP 110/70   Pulse 76   Temp 98.6 F (37 C) (Oral)   Ht 5' (1.524 m)   Wt 124 lb (56.2 kg)   SpO2 96%   BMI 24.22 kg/m  Vitals and nursing notes reviewed  Physical Exam  Constitutional: She is oriented to person, place, and time. She appears well-developed and well-nourished.  Non-toxic appearance. She does not appear ill.  HENT:  Head: Normocephalic and atraumatic.  Eyes: Pupils are equal, round, and reactive to light. EOM are normal. Right eye exhibits no nystagmus. Left eye exhibits no nystagmus.  Neck: Normal range of motion. Neck supple. No tracheal deviation present.    Palpable, non-tender thyroid  Cardiovascular: Normal rate and regular rhythm. Exam reveals no gallop and no friction rub.  No murmur heard. Pulmonary/Chest: Effort normal and breath sounds normal. No stridor. No respiratory distress. She has no wheezes. She has no rales.  Abdominal: Soft. Bowel sounds are normal.  There is no tenderness.  Musculoskeletal: She exhibits no edema.       Arms: Lymphadenopathy:    She has no cervical adenopathy.  Neurological: She is alert and oriented to person, place, and time. She has normal strength. No cranial nerve deficit or sensory deficit. Coordination and gait normal.  Negative finger-to-nose  Skin: Skin is warm and dry. Rash (healed rash on neck) noted. She is not diaphoretic. No pallor.  Psychiatric:  Appears very concerned and is tearful when discussing concerns about her health     No results found for this or any previous visit (from the past 72 hour(s)).  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  Differential also includes cervicogenic headache.  Doubt intracranial process as CT scan, CTA head and neck, and Lumbar punture in ED are all negative and patient has had no further headaches, only neck pain, and is neurologically intact.  Suspect stress is a factor in this patient's symptoms, as she was in a stressful situation at the hospital with her brother when onset of first headache occurred. -Start amitriptyline 25mg  QHS -Recheck in 3 weeks -Patient educated on red flag symptoms, including headache that will not go away, changes in balance, changes in vision, and was advised to go to the ED if these occur -Patient encouraged to call the clinic if symptoms do not improve -consider Neurology referral if symptoms do not resolve, not indicated at this time due to negative imaging workup, negative lumbar puncture, and patient is neurologically intact  Hypothyroidism Suspect fatigue due to hypothyroidism, as last TSH was 10/08/16.   - TSH today - cont Synthroid 14mcg QD, will consider adjusting dose pending TSH level   HLD (hyperlipidemia) Patient on Pravastatin 20mg  QD, is compliant with medications. 10-year ASCVD risk 3.9%. -Lipid Panel today -cont  Pravastatin 20mg  QD -repeat Lipid panel in 1 year      PATIENT EDUCATION PROVIDED: See AVS    Diagnosis and plan along with any newly prescribed medication(s) were discussed in detail with this patient today. The patient verbalized understanding and agreed with the plan. Patient advised if symptoms worsen return to clinic or ER.   Health Maintainance: Not addressed   Orders Placed This Encounter  Procedures  . TSH  . Lipid Panel    Meds ordered this encounter  Medications  . DISCONTD: amitriptyline (ELAVIL) tablet 25 mg  . DISCONTD: amitriptyline (ELAVIL) 25 MG tablet    Sig: Take 1 tablet (25 mg total) by mouth at bedtime.    Dispense:  30 tablet    Refill:  2  . amitriptyline (ELAVIL) 25 MG tablet    Sig: Take 1 tablet (25 mg total) by mouth at  bedtime.    Dispense:  30 tablet    Refill:  2  . levothyroxine (SYNTHROID, LEVOTHROID) 75 MCG tablet    Sig: Take 1 tablet (75 mcg total) by mouth daily.    Dispense:  30 tablet    Refill:  3    Needs office visit before any more refills provided.  . pravastatin (PRAVACHOL) 20 MG tablet    Sig: Take 1 tablet (20 mg total) by mouth at bedtime.    Dispense:  30 tablet    Refill:  3    Needs office visit before refills provided.     [place] Constable, DO 11/21/2017, 8:42 PM PGY-1 [place]

## 2017-11-22 LAB — LIPID PANEL
CHOLESTEROL TOTAL: 138 mg/dL (ref 100–199)
Chol/HDL Ratio: 3.2 ratio (ref 0.0–4.4)
HDL: 43 mg/dL (ref >39)
LDL Calculated: 52 mg/dL (ref 0–99)
Triglycerides: 215 mg/dL — ABNORMAL HIGH (ref 0–149)
VLDL CHOLESTEROL CAL: 43 mg/dL — AB (ref 5–40)

## 2017-11-22 LAB — TSH: TSH: 0.484 u[IU]/mL (ref 0.450–4.500)

## 2017-12-01 ENCOUNTER — Encounter: Payer: Self-pay | Admitting: Family Medicine

## 2017-12-12 ENCOUNTER — Ambulatory Visit (INDEPENDENT_AMBULATORY_CARE_PROVIDER_SITE_OTHER): Payer: Self-pay | Admitting: Family Medicine

## 2017-12-12 ENCOUNTER — Ambulatory Visit: Payer: Self-pay | Admitting: Family Medicine

## 2017-12-12 VITALS — BP 110/75 | HR 74 | Temp 98.5°F | Wt 125.6 lb

## 2017-12-12 DIAGNOSIS — G43909 Migraine, unspecified, not intractable, without status migrainosus: Secondary | ICD-10-CM | POA: Insufficient documentation

## 2017-12-12 DIAGNOSIS — M542 Cervicalgia: Secondary | ICD-10-CM

## 2017-12-12 DIAGNOSIS — E039 Hypothyroidism, unspecified: Secondary | ICD-10-CM

## 2017-12-12 DIAGNOSIS — M5412 Radiculopathy, cervical region: Secondary | ICD-10-CM

## 2017-12-12 DIAGNOSIS — G43809 Other migraine, not intractable, without status migrainosus: Secondary | ICD-10-CM

## 2017-12-12 MED ORDER — ONDANSETRON HCL 4 MG PO TABS: 4.0000 mg | ORAL_TABLET | Freq: Three times a day (TID) | ORAL | 0 refills | Status: DC | PRN

## 2017-12-12 MED ORDER — LEVOTHYROXINE SODIUM 75 MCG PO TABS: 75.0000 ug | ORAL_TABLET | Freq: Every day | ORAL | 0 refills | Status: DC

## 2017-12-12 NOTE — Patient Instructions (Addendum)
It was great meeting you today! I think these headaches are consistent with migraine headaches. We can increase the dose of the amitrypiline to help prevent these headaches. Please double this dose at night to 50mg . We can do a combination of zofran and benadryl to help with acute headaches. I will also give some topical cream to help with the neck and back pain. If this proves to be too expensive, you can get some over the counter lidocaine cream which should be much less expensive.  Fue un placer conocerte hoy! Creo que estos dolores de [place] son consistentes con los dolores de cabeza por [place]. Podemos aumentar la dosis de amitrypiline para ayudar a prevenir estos dolores de [place]. Duplique esta dosis por la noche a 50 mg. Podemos hacer una combinacin de zofran y benadryl para ayudar con dolores de cabeza agudos. Tambin le dar un poco de crema tpica para ayudar con el dolor de cuello y espalda. Si esto resulta ser [employer], puede obtener un poco de crema de [place] de venta [place], que debera ser mucho menos costosa.

## 2017-12-16 NOTE — Progress Notes (Signed)
  Patient waived spanish interpreter. Preferred daughter to interpret.  HPI 62 year old who presents for headaches. She was seen on 8/2 for headache and diagnosed with occipital neuritis. She was started on amitriptyline 25mg  nightly. Since then the patient's neck pain and posterior distal head pain has been doing well. She now states that she is having a unilateral headache that waxes and wanes. She states that it does resolve with sleeping. She has not noticed any light sensitivity beyond what has previously been described. She has not noticed anything consistent with an aura.  CC: headaches   ROS:  Review of Systems See HPI for ROS.   CC, SH/smoking status, and VS noted  Objective: BP 110/75 (BP Location: Left Arm, Patient Position: Sitting, Cuff Size: Normal)   Pulse 74   Temp 98.5 F (36.9 C) (Oral)   Wt 125 lb 9.6 oz (57 kg)   SpO2 95%   BMI 24.53 kg/m  Gen: NAD, alert, cooperative, and pleasant. Elderly Latino woman in no acute distress HEENT: NCAT, EOMI, PERRL CV: RRR, no murmur Resp: CTAB, no wheezes, non-labored Abd: SNTND, BS present, no guarding or organomegaly Ext: No edema, warm Neuro: Alert and oriented, Speech clear, No gross deficits. Cn 2-12 intact, no photophobia   Assessment and plan:  Cervical neuralgia This has been doing much better since starting amitryptiline. NO longer having complaints. - continue amitrypitline, increaseing to 50mg  nightly for migraine ppx  Migraine Detailed workup in the ED in late July ruling out her aneurysm worsening. Likely having migraines based on presentation and history. Appears to have relative contraindication to triptan therapy with a known vertebral artery aneurysm and history of left sided weakness. Patient can do a combination of benadryl and zofran when having acute onset of headaches. Will increase amitryptiline to 50mg  nightly for ppx dosing. Propranolol contraindicated as ppx due to patient's blood pressure. -  increase amitryptiline to 50mg  nightly - zofran 4mg  and benadryl 25mg  with acute migraine   No orders of the defined types were placed in this encounter.   Meds ordered this encounter  Medications  . ondansetron (ZOFRAN) 4 MG tablet    Sig: Take 1 tablet (4 mg total) by mouth every 8 (eight) hours as needed for nausea or vomiting.    Dispense:  30 tablet    Refill:  0  . levothyroxine (SYNTHROID, LEVOTHROID) 75 MCG tablet    Sig: Take 1 tablet (75 mcg total) by mouth daily.    Dispense:  30 tablet    Refill:  0    Needs office visit before any more refills provided.     Guadalupe Dawn MD PGY-2 Family Medicine Resident  12/16/2017 5:13 PM

## 2017-12-16 NOTE — Assessment & Plan Note (Signed)
Detailed workup in the ED in late July ruling out her aneurysm worsening. Likely having migraines based on presentation and history. Appears to have relative contraindication to triptan therapy with a known vertebral artery aneurysm and history of left sided weakness. Patient can do a combination of benadryl and zofran when having acute onset of headaches. Will increase amitryptiline to 50mg  nightly for ppx dosing. Propranolol contraindicated as ppx due to patient's blood pressure. - increase amitryptiline to 50mg  nightly - zofran 4mg  and benadryl 25mg  with acute migraine

## 2017-12-16 NOTE — Assessment & Plan Note (Signed)
This has been doing much better since starting amitryptiline. NO longer having complaints. - continue amitrypitline, increaseing to 50mg  nightly for migraine ppx

## 2018-04-05 ENCOUNTER — Other Ambulatory Visit: Payer: Self-pay | Admitting: Family Medicine

## 2018-04-05 DIAGNOSIS — M542 Cervicalgia: Secondary | ICD-10-CM

## 2018-05-04 ENCOUNTER — Other Ambulatory Visit: Payer: Self-pay | Admitting: Family Medicine

## 2018-05-04 DIAGNOSIS — E039 Hypothyroidism, unspecified: Secondary | ICD-10-CM

## 2018-05-04 DIAGNOSIS — E782 Mixed hyperlipidemia: Secondary | ICD-10-CM

## 2018-06-02 ENCOUNTER — Other Ambulatory Visit: Payer: Self-pay | Admitting: Family Medicine

## 2018-06-02 DIAGNOSIS — E782 Mixed hyperlipidemia: Secondary | ICD-10-CM

## 2018-08-31 ENCOUNTER — Other Ambulatory Visit: Payer: Self-pay | Admitting: Family Medicine

## 2018-08-31 DIAGNOSIS — E039 Hypothyroidism, unspecified: Secondary | ICD-10-CM

## 2018-12-08 ENCOUNTER — Other Ambulatory Visit: Payer: Self-pay

## 2018-12-08 DIAGNOSIS — E782 Mixed hyperlipidemia: Secondary | ICD-10-CM

## 2018-12-08 DIAGNOSIS — E039 Hypothyroidism, unspecified: Secondary | ICD-10-CM

## 2018-12-08 MED ORDER — LEVOTHYROXINE SODIUM 75 MCG PO TABS: 75.0000 ug | ORAL_TABLET | Freq: Every day | ORAL | 0 refills | Status: DC

## 2018-12-08 MED ORDER — PRAVASTATIN SODIUM 20 MG PO TABS: ORAL_TABLET | ORAL | 0 refills | Status: DC

## 2018-12-08 NOTE — Telephone Encounter (Signed)
Please ask patient to schedule follow up visit with me in person Thanks Leeanne Rio, MD

## 2018-12-08 NOTE — Telephone Encounter (Signed)
Patient called and there was no answer.  LVM using [place] ID # [id number].  Patient instructed to call [facility] to make follow up appointment per Dr. Ardelia Mems.  Ozella Almond, [place]

## 2019-03-04 ENCOUNTER — Encounter: Payer: Self-pay | Admitting: Family Medicine

## 2019-03-08 ENCOUNTER — Other Ambulatory Visit: Payer: Self-pay | Admitting: Family Medicine

## 2019-03-08 DIAGNOSIS — E039 Hypothyroidism, unspecified: Secondary | ICD-10-CM

## 2019-03-08 DIAGNOSIS — E782 Mixed hyperlipidemia: Secondary | ICD-10-CM

## 2019-03-10 NOTE — Telephone Encounter (Signed)
Will refill for 30 days but patient needs appointment for any further refills Leeanne Rio, MD

## 2019-03-12 ENCOUNTER — Telehealth: Payer: Self-pay | Admitting: *Deleted

## 2019-03-12 NOTE — Telephone Encounter (Signed)
Pharmacy states that they will be changing manufacturers of levothyroxine from sandoz to euthyrox. Wants permission to change this for pt. Please advise.Deseree Kennon Holter, CMA

## 2019-03-17 ENCOUNTER — Telehealth: Payer: Self-pay

## 2019-03-17 NOTE — Telephone Encounter (Signed)
That's fine, thanks Leeanne Rio, MD

## 2019-03-17 NOTE — Telephone Encounter (Signed)
Called and LVM for patient to call [facility] and schedule a follow up visit with Dr. Ardelia Mems.  Was assisted by [facility] ID # [phone].  Ozella Almond, [place]

## 2019-03-24 NOTE — Telephone Encounter (Signed)
Pharmacy informed. Lawarence Meek Kennon Holter, CMA

## 2019-04-17 ENCOUNTER — Other Ambulatory Visit: Payer: Self-pay | Admitting: Family Medicine

## 2019-04-17 DIAGNOSIS — E782 Mixed hyperlipidemia: Secondary | ICD-10-CM

## 2019-04-17 DIAGNOSIS — E039 Hypothyroidism, unspecified: Secondary | ICD-10-CM

## 2019-05-18 ENCOUNTER — Other Ambulatory Visit: Payer: Self-pay | Admitting: Family Medicine

## 2019-05-18 DIAGNOSIS — E782 Mixed hyperlipidemia: Secondary | ICD-10-CM

## 2019-05-18 DIAGNOSIS — E039 Hypothyroidism, unspecified: Secondary | ICD-10-CM

## 2019-05-19 NOTE — Telephone Encounter (Signed)
Appointment scheduled for follow up.   Talbot Grumbling, RN

## 2019-06-01 ENCOUNTER — Other Ambulatory Visit: Payer: Self-pay

## 2019-06-01 ENCOUNTER — Ambulatory Visit (INDEPENDENT_AMBULATORY_CARE_PROVIDER_SITE_OTHER): Payer: PRIVATE HEALTH INSURANCE | Admitting: Family Medicine

## 2019-06-01 ENCOUNTER — Encounter: Payer: Self-pay | Admitting: Family Medicine

## 2019-06-01 VITALS — BP 112/80 | HR 73 | Wt 138.6 lb

## 2019-06-01 DIAGNOSIS — E782 Mixed hyperlipidemia: Secondary | ICD-10-CM

## 2019-06-01 DIAGNOSIS — Z23 Encounter for immunization: Secondary | ICD-10-CM

## 2019-06-01 DIAGNOSIS — E039 Hypothyroidism, unspecified: Secondary | ICD-10-CM

## 2019-06-01 MED ORDER — LEVOTHYROXINE SODIUM 75 MCG PO TABS: 75.0000 ug | ORAL_TABLET | Freq: Every day | ORAL | 0 refills | Status: DC

## 2019-06-01 MED ORDER — PRAVASTATIN SODIUM 20 MG PO TABS: 20.0000 mg | ORAL_TABLET | Freq: Every day | ORAL | 0 refills | Status: DC

## 2019-06-01 MED ORDER — CLOTRIMAZOLE 1 % EX CREA: 1.0000 "application " | TOPICAL_CREAM | Freq: Two times a day (BID) | CUTANEOUS | 0 refills | Status: DC

## 2019-06-01 NOTE — Assessment & Plan Note (Signed)
Refill pravastatin Assess fasting lipids in 1 month at follow up (want to avoid multiple blood draws)

## 2019-06-01 NOTE — Assessment & Plan Note (Signed)
Had hoped to check TSH today but as patient has been out of medications x2 weeks, would likely impact results. Refilled medications for 1 month. She will follow up in 1 month to recheck TSH.

## 2019-06-01 NOTE — Patient Instructions (Signed)
Come back in 1 month to get labs Sent in refills on medications Sent in cream to try on your neck  After you get orange card straightened out, schedule an appointment to see me  Be well, Dr. Ardelia Mems

## 2019-06-01 NOTE — Progress Notes (Signed)
  Date of Visit: 06/01/2019   HPI:  Sara Haas presents today for medication refill. This is my first time meeting her after she was reassigned to me when her prior PCP Dr. Mingo Amber left our office. Her daughter accompanies her to this visit and serves as Administrator, sports (at patient's preference, and patient declines video interpreter even when offered).  Hypothyroidism - takes levothyroxine 39mcg daily but has been out for 2 weeks as she needed appointment before getting more refills.  Hyperlipidemia - takes pravastatin 20mg  daily. Fasting today. Out for 2-3 weeks.  Dark skin on neck - present for about 6 months. Begins as a reddish spot and then turns hyperpigmented. Has done this repeatedly and now has several dark splotches on her anterior and posterior neck. Does not itch or hurt.  PHYSICAL EXAM: BP 112/80   Pulse 73   Wt 138 lb 9.6 oz (62.9 kg)   SpO2 98%   BMI 27.07 kg/m  Gen: no acute distress, pleasant, cooperative HEENT: normocephalic, atraumatic. Thyroid normal in size Heart: regular rate and rhythm, no murmur Lungs: clear to auscultation bilaterally, normal work of breathing  Neuro: alert, speech normal, grossly nonfocal Skin: hyperpigmented macules on anterior and espsecially on posterior neck. Not particularly velvet or leather-like to touch.   ASSESSMENT/PLAN:  Health maintenance:  -needs pap, colonoscopy, Tdap, mammogram, DEXA addressed at visit in 1 month -flu shot given today -pneumovax 23 given today  Hypothyroidism Had hoped to check TSH today but as patient has been out of medications x2 weeks, would likely impact results. Refilled medications for 1 month. She will follow up in 1 month to recheck TSH.  HLD (hyperlipidemia) Refill pravastatin Assess fasting lipids in 1 month at follow up (want to avoid multiple blood draws)  Hyperpigmented macules May represent acanthosis but appearance not very typical. Will check A1c with labs when she comes in a month.  Differential diagnosis also includes tinea versicolor. Trial of clotrimazole twice daily to monitor for improvement. Reassess at follow up.   FOLLOW UP: Follow up in 1 mo for above issues  [place] J. Ardelia Mems, [place]

## 2019-06-24 ENCOUNTER — Ambulatory Visit: Payer: Self-pay | Attending: Internal Medicine

## 2019-06-24 DIAGNOSIS — Z23 Encounter for immunization: Secondary | ICD-10-CM | POA: Insufficient documentation

## 2019-06-24 NOTE — Progress Notes (Signed)
   Covid-19 Vaccination Clinic  Name:  Mikaili Kutzler    MRN: [id number] DOB: [birth year]  06/24/2019  [name] was observed post Covid-19 immunization for 15 minutes without incident. She was provided with Vaccine Information Sheet and instruction to access the V-Safe system.   Ms. Veal was instructed to call 911 with any severe reactions post vaccine: Marland Kitchen Difficulty breathing  . Swelling of face and throat  . A fast heartbeat  . A bad rash all over body  . Dizziness and weakness

## 2019-06-29 ENCOUNTER — Ambulatory Visit: Payer: Self-pay | Admitting: Family Medicine

## 2019-07-06 ENCOUNTER — Ambulatory Visit (INDEPENDENT_AMBULATORY_CARE_PROVIDER_SITE_OTHER): Payer: PRIVATE HEALTH INSURANCE | Admitting: Family Medicine

## 2019-07-06 ENCOUNTER — Other Ambulatory Visit: Payer: Self-pay

## 2019-07-06 VITALS — BP 115/70 | HR 71 | Wt 136.8 lb

## 2019-07-06 DIAGNOSIS — E782 Mixed hyperlipidemia: Secondary | ICD-10-CM

## 2019-07-06 DIAGNOSIS — Z1211 Encounter for screening for malignant neoplasm of colon: Secondary | ICD-10-CM

## 2019-07-06 DIAGNOSIS — E2839 Other primary ovarian failure: Secondary | ICD-10-CM

## 2019-07-06 DIAGNOSIS — Z1231 Encounter for screening mammogram for malignant neoplasm of breast: Secondary | ICD-10-CM

## 2019-07-06 DIAGNOSIS — E039 Hypothyroidism, unspecified: Secondary | ICD-10-CM

## 2019-07-06 LAB — POCT GLYCOSYLATED HEMOGLOBIN (HGB A1C): Hemoglobin A1C: 5.9 % — AB (ref 4.0–5.6)

## 2019-07-06 MED ORDER — LEVOTHYROXINE SODIUM 75 MCG PO TABS: 75.0000 ug | ORAL_TABLET | Freq: Every day | ORAL | 0 refills | Status: DC

## 2019-07-06 MED ORDER — PRAVASTATIN SODIUM 20 MG PO TABS: 20.0000 mg | ORAL_TABLET | Freq: Every day | ORAL | 0 refills | Status: DC

## 2019-07-06 NOTE — Assessment & Plan Note (Signed)
Refill pravastatin Assess fasting lipids today

## 2019-07-06 NOTE — Patient Instructions (Signed)
It was great to see you again today!  Checking labs today. Will call or send letter  Schedule appointment at end of April for pap smear. Will plan to do tetanus shot at that time.  Referred to GI for colonoscopy  Scheduled mammogram and bone density test  Be well, Dr. Ardelia Mems

## 2019-07-06 NOTE — Progress Notes (Signed)
    SUBJECTIVE:   CHIEF COMPLAINT / HPI:    Daughter serves as patient's interpreter at patient's request  Hypothyroidism - Takes levothyroxine 56mcg daily but has been out since 06/29/19.  Hyperlipidemia - Takes pravastatin 20 mg daily. Fasting today. Needs refill.  Dark skin on neck - Begins as a reddish spot that itches and then turns unto hyperpigmented macules that do not itch. Has tried Clotrimazole 1% cream  2x daily with no improvement.  PERTINENT  PMH / PSH: Hypothyroid, HLD  OBJECTIVE:   BP 115/70   Pulse 71   Wt 136 lb 12.8 oz (62.1 kg)   SpO2 97%   BMI 26.72 kg/m   Gen: no acute distress, pleasant, cooperative HEENT: normocephalic, atraumatic. Thyroid normal in size Heart: regular rate and rhythm, no murmur Lungs: normal work of breathing  Neuro: alert, speech normal, grossly nonfocal  Skin: hyperpigmented macules on anterior and espsecially on posterior neck. Not particularly velvet or leather-like to touch.  ASSESSMENT/PLAN:   Health maintenance:  - Schedule appointment at end of April for pap smear. Will do Tdap at that time - has had COVID vaccine #1, await second dose - Referred to GI for colonoscopy - Schedule mammogram and bone density test  Hypothyroidism Check TSH today. She has been out of medication for 1 week so may need to recheck TSH at next visit. Refilled levothyroxine for 90 days.   HLD (hyperlipidemia) Refill pravastatin Assess fasting lipids today  Hyperpigmented macules May represent acanthosis but appearance not very typical. Checking A1c with labs today. Since they are not bothering her at all, we will watch them and reassess at follow up.     [place]   Patient seen along with MS3 student [place]. I personally evaluated this patient along with the student, and verified all aspects of the history, physical exam, and medical decision making as documented by the  student. I agree with the student's documentation and have made all necessary edits.  Chrisandra Netters, MD [place]

## 2019-07-06 NOTE — Assessment & Plan Note (Signed)
Check TSH today. She has been out of medication for 1 week so may need to recheck TSH at next visit. Refilled levothyroxine for 90 days.

## 2019-07-07 LAB — CMP14+EGFR
ALT: 18 IU/L (ref 0–32)
AST: 21 IU/L (ref 0–40)
Albumin/Globulin Ratio: 1.4 (ref 1.2–2.2)
Albumin: 4.5 g/dL (ref 3.8–4.8)
Alkaline Phosphatase: 85 IU/L (ref 39–117)
BUN/Creatinine Ratio: 36 — ABNORMAL HIGH (ref 12–28)
BUN: 20 mg/dL (ref 8–27)
Bilirubin Total: 0.4 mg/dL (ref 0.0–1.2)
CO2: 24 mmol/L (ref 20–29)
Calcium: 9.7 mg/dL (ref 8.7–10.3)
Chloride: 102 mmol/L (ref 96–106)
Creatinine, Ser: 0.55 mg/dL — ABNORMAL LOW (ref 0.57–1.00)
GFR calc Af Amer: 114 mL/min/{1.73_m2} (ref >59)
GFR calc non Af Amer: 99 mL/min/{1.73_m2} (ref >59)
Globulin, Total: 3.2 g/dL (ref 1.5–4.5)
Glucose: 94 mg/dL (ref 65–99)
Potassium: 5 mmol/L (ref 3.5–5.2)
Sodium: 140 mmol/L (ref 134–144)
Total Protein: 7.7 g/dL (ref 6.0–8.5)

## 2019-07-07 LAB — TSH: TSH: 10.2 u[IU]/mL — ABNORMAL HIGH (ref 0.450–4.500)

## 2019-07-07 LAB — LIPID PANEL
Chol/HDL Ratio: 4.1 ratio (ref 0.0–4.4)
Cholesterol, Total: 202 mg/dL — ABNORMAL HIGH (ref 100–199)
HDL: 49 mg/dL (ref >39)
LDL Chol Calc (NIH): 117 mg/dL — ABNORMAL HIGH (ref 0–99)
Triglycerides: 208 mg/dL — ABNORMAL HIGH (ref 0–149)
VLDL Cholesterol Cal: 36 mg/dL (ref 5–40)

## 2019-07-14 ENCOUNTER — Encounter: Payer: Self-pay | Admitting: Family Medicine

## 2019-07-14 DIAGNOSIS — R7303 Prediabetes: Secondary | ICD-10-CM | POA: Insufficient documentation

## 2019-07-21 ENCOUNTER — Ambulatory Visit: Payer: Self-pay | Attending: Internal Medicine

## 2019-07-21 DIAGNOSIS — Z23 Encounter for immunization: Secondary | ICD-10-CM

## 2019-07-21 NOTE — Progress Notes (Signed)
   Covid-19 Vaccination Clinic  Name:  Sara Haas    MRN: [id number] DOB: 1955/10/30  07/21/2019  [name] was observed post Covid-19 immunization for 15 minutes without incident. She was provided with Vaccine Information Sheet and instruction to access the V-Safe system.   [name] was instructed to call 911 with any severe reactions post vaccine: Marland Kitchen Difficulty breathing  . Swelling of face and throat  . A fast heartbeat  . A bad rash all over body  . Dizziness and weakness   Immunizations Administered    Name Date Dose VIS Date Route   Pfizer COVID-19 Vaccine 07/21/2019 10:31 AM 0.3 mL 04/02/2019 Intramuscular   Manufacturer: [place]   Lot: [id number]   [place]: [id number]

## 2019-08-17 ENCOUNTER — Ambulatory Visit (INDEPENDENT_AMBULATORY_CARE_PROVIDER_SITE_OTHER): Payer: PRIVATE HEALTH INSURANCE | Admitting: Family Medicine

## 2019-08-17 ENCOUNTER — Encounter: Payer: Self-pay | Admitting: Family Medicine

## 2019-08-17 ENCOUNTER — Other Ambulatory Visit: Payer: Self-pay

## 2019-08-17 VITALS — BP 122/70 | HR 70 | Ht 58.47 in | Wt 138.8 lb

## 2019-08-17 DIAGNOSIS — E039 Hypothyroidism, unspecified: Secondary | ICD-10-CM

## 2019-08-17 DIAGNOSIS — Z23 Encounter for immunization: Secondary | ICD-10-CM

## 2019-08-17 DIAGNOSIS — R7303 Prediabetes: Secondary | ICD-10-CM

## 2019-08-17 NOTE — Patient Instructions (Signed)
It was great to see you again today!  Checking TSH today Do not need pap smears  Stop benadryl See sleep handout below  Tetanus shot today Schedule mammogram and bone density test  If TSH normal can follow up in 1 year, sooner if needed  Be well, Dr. Marlowe Shores Insomnia El insomnio es un trastorno del sueo que causa dificultades para conciliar el sueo o para [place]. Puede producir fatiga, falta de energa, dificultad para concentrarse, cambios en el estado de nimo y mal rendimiento escolar o laboral. Hay tres formas diferentes de clasificar el insomnio:  Dificultad para conciliar el sueo.  Dificultad para mantener el sueo.  Despertar muy precoz por la maana. Cualquier tipo de insomnio puede ser a Barrister's clerk (crnico) o a Control and instrumentation engineer (agudo). Ambos son frecuentes. Generalmente, el insomnio a corto plazo dura tres meses o menos tiempo. El crnico ocurre al menos tres veces por semana durante ms de tres meses. Cules son las causas? El insomnio puede deberse a otra afeccin, situacin o sustancia, por ejemplo:  Ansiedad.  Ciertos medicamentos.  Enfermedad de reflujo gastroesofgico (ERGE) u otras enfermedades gastrointestinales.  Asma y otras enfermedades respiratorias.  Sndrome de las piernas inquietas, apnea del sueo u otros trastornos del sueo.  Dolor crnico.  Menopausia.  Accidente cerebrovascular.  Consumo excesivo de alcohol, tabaco u drogas ilegales.  Afecciones de salud mental, como depresin.  Cafena.  Trastornos neurolgicos, como enfermedad de Alzheimer.  Hiperactividad de la glndula tiroidea (hipertiroidismo). En ocasiones, la causa del insomnio puede ser desconocida. Qu incrementa el riesgo? Los factores de riesgo de tener insomnio incluyen lo siguiente:  Sexo. La enfermedad afecta ms a menudo a las mujeres que a los hombres.  Edad. El insomnio es ms frecuente a medida que una persona envejece.  Estrs.  La  falta de actividad fsica.  Los horarios de trabajo irregulares o los turnos nocturnos.  Los viajes a lugares de diferentes zonas horarias.  Ciertas afecciones mdicas y de salud mental. Cules son los signos o los sntomas? Si tiene insomnio, el sntoma principal es la dificultad para conciliar el sueo o mantenerlo. Esto puede derivar en otros sntomas, por ejemplo:  Sentirse fatigado o tener poca energa.  Ponerse nervioso por [employer] irse a dormir.  No sentirse descansado por la maana.  Tener dificultad para concentrarse.  Sentirse irritable, ansioso o deprimido. Cmo se diagnostica? Esta afeccin se puede diagnosticar en funcin de lo siguiente:  Los sntomas y antecedentes mdicos. El mdico puede hacerle preguntas sobre: ? Hbitos de sueo. ? Cualquier afeccin mdica que tenga. ? La salud mental.  Un examen fsico. Cmo se trata? El tratamiento para el insomnio depende de la causa. El tratamiento puede centrarse en tratar Ardelia Mems afeccin preexistente que causa el insomnio. El tratamiento tambin puede incluir lo siguiente:  Medicamentos que lo ayuden a dormir.  Asesoramiento psicolgico o terapia.  Ajustes en el estilo de vida para ayudarlo a dormir mejor. Siga estas indicaciones en su casa: Comida y bebida   Limite o evite el consumo de alcohol, bebidas con cafena y cigarrillos, especialmente cerca de la hora de [place], ya que pueden perturbarle el sueo.  No consuma una comida suculenta ni coma alimentos condimentados justo antes de la hora de [place]. Esto puede causarle molestias digestivas y dificultades para dormir. Hbitos de sueo   Lleve un registro del sueo ya que podra ser de utilidad para que usted y a su mdico puedan determinar qu podra estar causndole insomnio.  Escriba los siguientes datos: ? Cundo duerme. ? Cundo se despierta durante la noche. ? Qu tan bien duerme. ? Qu tan relajado se siente al da siguiente. ? Cualquier  efecto secundario de los [employer] toma. ? Lo que usted come y bebe.  Convierta su habitacin en un lugar oscuro, cmodo donde sea fcil conciliar el sueo. ? Coloque persianas o cortinas oscuras que impidan la entrada de la luz del exterior. ? Para bloquear los ruidos, use un aparato que reproduzca sonidos ambientales o relajantes de fondo. ? Mantenga baja la temperatura.  Limite el uso de pantallas antes de la hora de [place]. Esto incluye lo siguiente: ? Mirar televisin. ? Usar el telfono inteligente, la tableta o la computadora.  Siga una rutina que incluya ir a dormir y Clinical cytogeneticist a la misma [place] y noche. Esto puede ayudarlo a conciliar el sueo ms rpidamente. Considere realizar [employer] tranquila, como leer, e incorporarla como parte de la rutina a la hora de irse a dormir.  Trate de evitar tomar siestas durante el da para que pueda dormir mejor por la noche.  Levntese de la cama si sigue despierto despus de 63minutos de haber intentado dormirse. Jellico luces, pero intente leer o hacer una actividad tranquila. Cuando tenga sueo, regrese a Futures trader. Instrucciones generales  [employer] de venta libre y los recetados solamente como se lo haya indicado el mdico.  Realice ejercicio con regularidad como se lo haya indicado el mdico. Evite la actividad fsica desde varias horas antes de irse a dormir.  Utilice tcnicas de relajacin para controlar el estrs. Pdale al mdico que le sugiera algunas tcnicas que sean adecuadas para usted. Estos pueden incluir lo siguiente: ? Ejercicios de respiracin. ? Rutinas para aliviar la tensin muscular. ? Visualizacin de escenas apacibles.  Conduzca con cuidado. No conduzca si est muy somnoliento.  Concurra a todas las visitas de control como se lo haya indicado el mdico. Esto es importante. Comunquese con un mdico si:  Est cansado durante todo Games developer.  Tiene dificultad en su  rutina diaria debido a la somnolencia.  Sigue teniendo problemas para dormir o Press photographer. Solicite ayuda de inmediato si:  Piensa seriamente en lastimarse a usted mismo o a Nurse, children's. Si alguna vez siente que puede lastimarse a usted mismo o a Producer, television/film/video, o tiene pensamientos de poner fin a su vida, busque ayuda de inmediato. Puede dirigirse al servicio de emergencias ms cercano o comunicarse con:  El servicio de emergencias de su localidad (911 en [place]  Una lnea de asistencia al suicida y Freight forwarder en crisis, como la [employer] de Prevencin del Suicidio (National Suicide Prevention Lifeline), al [phone]. Est disponible las 24 horas del da. Resumen  El insomnio es un trastorno del sueo que causa dificultades para conciliar el sueo o para [place].  El insomnio puede ser a Barrister's clerk (crnico) o a Control and instrumentation engineer (agudo).  El tratamiento para el insomnio depende de la causa. El tratamiento puede centrarse en tratar Ardelia Mems afeccin preexistente que causa el insomnio.  Lleve un registro del sueo ya que podra ser de utilidad para que usted y a su mdico puedan determinar qu podra estar causndole insomnio. Esta informacin no tiene Marine scientist el consejo del mdico. Asegrese de hacerle al mdico cualquier pregunta que tenga. Document Revised: 07/19/2017 Document Reviewed: 03/28/2017 Elsevier Patient Education  [place].

## 2019-08-17 NOTE — Progress Notes (Signed)
    SUBJECTIVE:   CHIEF COMPLAINT / HPI:   Hypothyroid: Has been able to take her levothyroxine 24mcg as prescribed. She feels well and does not endorse any increased or decreased energy, cold or heat intolerance. She notes increased appetite after restarting the levothyroxine.  Prediabetes: A1c at 5.9 on 07/06/2019 putting her in the prediabetes range.   Insomnia: She endorses having trouble falling asleep at night which she states has been going on for several years. She says this is due to the fact that she often over-thinks things and her thoughts keep her up at night. Additionally, she spends a lot of time during the evening in her bed watching TV and scrolling on her phone/reading. A friend of hers recommended her to try an allergy medication that has diphenhydramine in it to help her sleep. We spoke about stopping this medication due to the diphenhydramine component and its side effect profile in people her age. She did endorse some intermittent slight ataxia after starting this medication.  PERTINENT  PMH / PSH: hypothyroidism, pre-diabetes   OBJECTIVE:   BP 122/70   Pulse 70   Ht 4' 10.47" (1.485 m)   Wt 138 lb 12.8 oz (63 kg)   SpO2 97%   BMI 28.55 kg/m   Physical Exam  Constitutional: She is well-developed, well-nourished, and in no distress.  Cardiovascular: Normal heart sounds.  Pulmonary/Chest: Effort normal and breath sounds normal.  Musculoskeletal:     Cervical back: Neck supple.  Neurological: She is alert. speech normal. Grossly nonfocal. Gait normal. Skin: She is not diaphoretic.  Psychiatric: Affect normal.    ASSESSMENT/PLAN:   Health Maintenance - COVID vaccine #2 on 07/21/2019 - Tdap done today - Had a total hysterectomy with removal of cervix for benign reasons, so pap smear not necessary - Referred to GI for colonoscopy at last visit, she is waiting to hear from them about scheduling appointment - Schedule mammogram and bone density test, patient knows  how to schedule  Hypothyroidism Checking TSH today. If normal will follow-up in 1 year  Prediabetes Counseled on healthy eating habits (increasing salads, vegetables and decreasing fats) and walking for 30 min 3x a week. Will recheck A1c in 1 year.   Insomnia Counseled about stopping the diphenhydramine. She understood the risks of this medication and confirmed that she would not take it anymore. She stated that she did not want an additional medication for sleep. We spoke at length about sleep hygiene techniques and provided her with a handout with this information on it as well.   [place]   Patient seen along with MS3 student [place]. I personally evaluated this patient along with the student, and verified all aspects of the history, physical exam, and medical decision making as documented by the student. I agree with the student's documentation and have made all necessary edits.  Chrisandra Netters, MD [place] than 30 minutes were spent on this encounter on the day of service, including pre-visit planning, actual face to face time, coordination of care, and documentation of visit.

## 2019-08-17 NOTE — Assessment & Plan Note (Signed)
Counseled on healthy eating habits (increasing salads, vegetables and decreasing fats) and walking for 30 min 3x a week. Will recheck A1c in 1 year.

## 2019-08-17 NOTE — Assessment & Plan Note (Signed)
Checking TSH today. If normal will follow-up in 1 year

## 2019-08-18 LAB — TSH: TSH: 3.19 u[IU]/mL (ref 0.450–4.500)

## 2019-08-20 ENCOUNTER — Other Ambulatory Visit: Payer: Self-pay | Admitting: Family Medicine

## 2019-08-20 ENCOUNTER — Encounter: Payer: Self-pay | Admitting: Family Medicine

## 2019-08-20 DIAGNOSIS — E039 Hypothyroidism, unspecified: Secondary | ICD-10-CM

## 2019-08-20 MED ORDER — LEVOTHYROXINE SODIUM 75 MCG PO TABS: 75.0000 ug | ORAL_TABLET | Freq: Every day | ORAL | 3 refills | Status: DC

## 2019-10-02 ENCOUNTER — Other Ambulatory Visit: Payer: Self-pay | Admitting: Family Medicine

## 2019-10-02 DIAGNOSIS — E039 Hypothyroidism, unspecified: Secondary | ICD-10-CM

## 2019-10-02 DIAGNOSIS — E782 Mixed hyperlipidemia: Secondary | ICD-10-CM

## 2020-03-08 ENCOUNTER — Telehealth: Payer: Self-pay | Admitting: Family Medicine

## 2020-03-08 NOTE — Telephone Encounter (Signed)
Called patient's daughter to inform her of free walk-in mammograms today She was appreciative but states they will not be able to make it today  Leeanne Rio, MD

## 2020-09-19 ENCOUNTER — Other Ambulatory Visit: Payer: Self-pay | Admitting: Family Medicine

## 2020-09-19 DIAGNOSIS — E039 Hypothyroidism, unspecified: Secondary | ICD-10-CM

## 2020-09-19 DIAGNOSIS — E782 Mixed hyperlipidemia: Secondary | ICD-10-CM

## 2020-09-21 NOTE — Telephone Encounter (Signed)
Please let patient know I am refilling this medication, but she needs to schedule an appointment with me.   Thanks, Ka Flammer J Lamanda Rudder, MD  

## 2020-12-24 ENCOUNTER — Other Ambulatory Visit: Payer: Self-pay | Admitting: Family Medicine

## 2020-12-24 DIAGNOSIS — E782 Mixed hyperlipidemia: Secondary | ICD-10-CM

## 2020-12-24 DIAGNOSIS — E039 Hypothyroidism, unspecified: Secondary | ICD-10-CM

## 2020-12-27 NOTE — Telephone Encounter (Signed)
Please let patient know I am refilling this medication for 30 days, but she needs to schedule an appointment with me. I have not seen her in nearly 1.5 years and will not be able to refill again until she comes in for an appointment.  Thanks, Leeanne Rio, MD

## 2021-01-30 ENCOUNTER — Other Ambulatory Visit: Payer: Self-pay

## 2021-01-30 ENCOUNTER — Other Ambulatory Visit: Payer: Self-pay | Admitting: Family Medicine

## 2021-01-30 ENCOUNTER — Encounter: Payer: Self-pay | Admitting: Family Medicine

## 2021-01-30 ENCOUNTER — Ambulatory Visit (INDEPENDENT_AMBULATORY_CARE_PROVIDER_SITE_OTHER): Payer: PRIVATE HEALTH INSURANCE | Admitting: Family Medicine

## 2021-01-30 VITALS — BP 120/83 | HR 69 | Wt 128.0 lb

## 2021-01-30 DIAGNOSIS — E039 Hypothyroidism, unspecified: Secondary | ICD-10-CM

## 2021-01-30 DIAGNOSIS — I726 Aneurysm of vertebral artery: Secondary | ICD-10-CM

## 2021-01-30 DIAGNOSIS — E782 Mixed hyperlipidemia: Secondary | ICD-10-CM

## 2021-01-30 DIAGNOSIS — Z1211 Encounter for screening for malignant neoplasm of colon: Secondary | ICD-10-CM

## 2021-01-30 DIAGNOSIS — R7303 Prediabetes: Secondary | ICD-10-CM

## 2021-01-30 MED ORDER — LEVOTHYROXINE SODIUM 75 MCG PO TABS: 75.0000 ug | ORAL_TABLET | Freq: Every day | ORAL | 0 refills | Status: DC

## 2021-01-30 NOTE — Progress Notes (Signed)
  Date of Visit: 01/30/2021   SUBJECTIVE:   HPI:  Sara Haas presents today for routine follow up.  Hypothyroidism - currently taking levothryoxine 39mcg daily but has been out of this medication for 5 days.  Hyperlipidemia - taking pravastatin 20mg  daily.   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 Agreeable to seeing them again.  OBJECTIVE:   BP (!) 143/88   Pulse 69   Wt 128 lb (58.1 kg)   SpO2 97%   BMI 26.33 kg/m  Gen: no acute distress, pleasant, cooperative HEENT: normocephalic, atraumatic  Heart: regular rate and rhythm, no murmur Lungs: clear to auscultation bilaterally, normal work of breathing  Neuro: alert, speech normal, grossly nonfocal Ext: no edema  ASSESSMENT/PLAN:   Health maintenance:  -flu shot given today -bivalent COVID booster given today -re-refer to GI for colonoscopy, patient agreeable -gave paperwork for mammogram scholarship -gave info on how to schedule mammo & DEXA scan  Hypothyroidism Had planned to update TSH during today's visit (01/30/21) but as patient has been out of medications x5 days, will refill her levothyroxine and have her schedule a lab visit in about 4 weeks to recheck. Will titrate levothyroxine as needed once that lab result has returned.  HLD (hyperlipidemia) Remains on pravastaitn. Will check lipids when she returns in 4 weeks for lab appointment.   Vertebral artery aneurysm ([place]) Noted on problem list, has not followed up with neurology in about 6 years Re-refer to neuro, patient agreeable  FOLLOW UP: Follow up in 4 weeks for lab visit Referring to GI for colonoscopy Referring to neuro for aneurysm  [place] J. Ardelia Mems, [place]

## 2021-01-30 NOTE — Patient Instructions (Addendum)
It was great to see you again today!  Return in 4 weeks for lab visit to check thyroid, cholesterol, kidneys.  Refilled thyroid medication for 1 month  Referring back to neurology for the aneurysm in your head  Referring to GI for colonoscopy  Flu and COVID booster today  To schedule your mammogram and bone density test, call the [facility] at [phone]. They are located at [address].    Be well, Dr. Ardelia Mems

## 2021-02-05 NOTE — Assessment & Plan Note (Signed)
Remains on pravastaitn. Will check lipids when she returns in 4 weeks for lab appointment.

## 2021-02-05 NOTE — Assessment & Plan Note (Signed)
Noted on problem list, has not followed up with neurology in about 6 years Re-refer to neuro, patient agreeable

## 2021-02-05 NOTE — Assessment & Plan Note (Signed)
Had planned to update TSH during today's visit (01/30/21) but as patient has been out of medications x5 days, will refill her levothyroxine and have her schedule a lab visit in about 4 weeks to recheck. Will titrate levothyroxine as needed once that lab result has returned.

## 2021-02-27 ENCOUNTER — Other Ambulatory Visit: Payer: Self-pay

## 2021-02-27 ENCOUNTER — Telehealth: Payer: Self-pay | Admitting: Family Medicine

## 2021-02-27 ENCOUNTER — Other Ambulatory Visit: Payer: PRIVATE HEALTH INSURANCE

## 2021-02-27 DIAGNOSIS — E039 Hypothyroidism, unspecified: Secondary | ICD-10-CM

## 2021-02-27 DIAGNOSIS — E782 Mixed hyperlipidemia: Secondary | ICD-10-CM

## 2021-02-27 DIAGNOSIS — E875 Hyperkalemia: Secondary | ICD-10-CM

## 2021-02-27 DIAGNOSIS — R7303 Prediabetes: Secondary | ICD-10-CM

## 2021-02-27 NOTE — Telephone Encounter (Signed)
Patient came in today for a lab visit, daughter asked if all patients refills would be called in after lab work. I told patients daughter that I would send a message back. Please advise. Thanks!

## 2021-02-28 LAB — TSH: TSH: 2.48 u[IU]/mL (ref 0.450–4.500)

## 2021-02-28 LAB — LIPID PANEL
Chol/HDL Ratio: 2.4 ratio (ref 0.0–4.4)
Cholesterol, Total: 149 mg/dL (ref 100–199)
HDL: 61 mg/dL (ref >39)
LDL Chol Calc (NIH): 71 mg/dL (ref 0–99)
Triglycerides: 94 mg/dL (ref 0–149)
VLDL Cholesterol Cal: 17 mg/dL (ref 5–40)

## 2021-02-28 LAB — BASIC METABOLIC PANEL
BUN/Creatinine Ratio: 33 — ABNORMAL HIGH (ref 12–28)
BUN: 20 mg/dL (ref 8–27)
CO2: 24 mmol/L (ref 20–29)
Calcium: 10 mg/dL (ref 8.7–10.3)
Chloride: 104 mmol/L (ref 96–106)
Creatinine, Ser: 0.61 mg/dL (ref 0.57–1.00)
Glucose: 98 mg/dL (ref 70–99)
Potassium: 5.6 mmol/L — ABNORMAL HIGH (ref 3.5–5.2)
Sodium: 140 mmol/L (ref 134–144)
eGFR: 98 mL/min/{1.73_m2} (ref >59)

## 2021-03-02 MED ORDER — LEVOTHYROXINE SODIUM 75 MCG PO TABS: 75.0000 ug | ORAL_TABLET | Freq: Every day | ORAL | 3 refills | Status: DC

## 2021-03-02 MED ORDER — PRAVASTATIN SODIUM 20 MG PO TABS: ORAL_TABLET | ORAL | 3 refills | Status: DC

## 2021-03-02 NOTE — Telephone Encounter (Signed)
Called daughter to discuss lab results (DPR on file) Advised K elevated - recommend rechecking next week. Lab visit scheduled for 11/15 at 11:15.  Advised I will send in refills of other chronic medications. [place] and TSH look great.  Daughter appreciative.  Leeanne Rio, MD

## 2021-03-06 ENCOUNTER — Other Ambulatory Visit: Payer: Self-pay

## 2021-03-06 ENCOUNTER — Other Ambulatory Visit: Payer: PRIVATE HEALTH INSURANCE

## 2021-03-06 DIAGNOSIS — E875 Hyperkalemia: Secondary | ICD-10-CM

## 2021-03-07 ENCOUNTER — Encounter: Payer: Self-pay | Admitting: Family Medicine

## 2021-03-07 LAB — BASIC METABOLIC PANEL
BUN/Creatinine Ratio: 26 (ref 12–28)
BUN: 17 mg/dL (ref 8–27)
CO2: 22 mmol/L (ref 20–29)
Calcium: 9.8 mg/dL (ref 8.7–10.3)
Chloride: 104 mmol/L (ref 96–106)
Creatinine, Ser: 0.66 mg/dL (ref 0.57–1.00)
Glucose: 123 mg/dL — ABNORMAL HIGH (ref 70–99)
Potassium: 4.8 mmol/L (ref 3.5–5.2)
Sodium: 142 mmol/L (ref 134–144)
eGFR: 96 mL/min/{1.73_m2} (ref >59)

## 2021-03-09 ENCOUNTER — Emergency Department (HOSPITAL_COMMUNITY): Payer: No Typology Code available for payment source

## 2021-03-09 ENCOUNTER — Encounter (HOSPITAL_COMMUNITY): Payer: Self-pay | Admitting: Emergency Medicine

## 2021-03-09 ENCOUNTER — Other Ambulatory Visit: Payer: Self-pay

## 2021-03-09 ENCOUNTER — Emergency Department (HOSPITAL_COMMUNITY)
Admission: EM | Admit: 2021-03-09 | Discharge: 2021-03-10 | Disposition: A | Discharge status: Home / Self Care | Payer: No Typology Code available for payment source | Attending: Emergency Medicine | Admitting: Emergency Medicine

## 2021-03-09 DIAGNOSIS — M25512 Pain in left shoulder: Secondary | ICD-10-CM | POA: Diagnosis not present

## 2021-03-09 DIAGNOSIS — R0789 Other chest pain: Secondary | ICD-10-CM | POA: Insufficient documentation

## 2021-03-09 DIAGNOSIS — R42 Dizziness and giddiness: Secondary | ICD-10-CM | POA: Diagnosis not present

## 2021-03-09 DIAGNOSIS — Y92410 Unspecified street and highway as the place of occurrence of the external cause: Secondary | ICD-10-CM | POA: Diagnosis not present

## 2021-03-09 DIAGNOSIS — Z7982 Long term (current) use of aspirin: Secondary | ICD-10-CM | POA: Diagnosis not present

## 2021-03-09 DIAGNOSIS — J45909 Unspecified asthma, uncomplicated: Secondary | ICD-10-CM | POA: Diagnosis not present

## 2021-03-09 DIAGNOSIS — H53149 Visual discomfort, unspecified: Secondary | ICD-10-CM | POA: Insufficient documentation

## 2021-03-09 DIAGNOSIS — Z79899 Other long term (current) drug therapy: Secondary | ICD-10-CM | POA: Diagnosis not present

## 2021-03-09 DIAGNOSIS — R519 Headache, unspecified: Secondary | ICD-10-CM | POA: Diagnosis not present

## 2021-03-09 DIAGNOSIS — E039 Hypothyroidism, unspecified: Secondary | ICD-10-CM | POA: Diagnosis not present

## 2021-03-09 LAB — CBC WITH DIFFERENTIAL/PLATELET
Abs Immature Granulocytes: 0.06 10*3/uL (ref 0.00–0.07)
Basophils Absolute: 0 10*3/uL (ref 0.0–0.1)
Basophils Relative: 0 %
Eosinophils Absolute: 0 10*3/uL (ref 0.0–0.5)
Eosinophils Relative: 0 %
HCT: 42.1 % (ref 36.0–46.0)
Hemoglobin: 13.5 g/dL (ref 12.0–15.0)
Immature Granulocytes: 0 %
Lymphocytes Relative: 20 %
Lymphs Abs: 2.8 10*3/uL (ref 0.7–4.0)
MCH: 27.7 pg (ref 26.0–34.0)
MCHC: 32.1 g/dL (ref 30.0–36.0)
MCV: 86.4 fL (ref 80.0–100.0)
Monocytes Absolute: 1.3 10*3/uL — ABNORMAL HIGH (ref 0.1–1.0)
Monocytes Relative: 9 %
Neutro Abs: 9.6 10*3/uL — ABNORMAL HIGH (ref 1.7–7.7)
Neutrophils Relative %: 71 %
Platelets: 333 10*3/uL (ref 150–400)
RBC: 4.87 MIL/uL (ref 3.87–5.11)
RDW: 14.1 % (ref 11.5–15.5)
WBC: 13.8 10*3/uL — ABNORMAL HIGH (ref 4.0–10.5)
nRBC: 0 % (ref 0.0–0.2)

## 2021-03-09 LAB — TROPONIN I (HIGH SENSITIVITY)
Troponin I (High Sensitivity): 8 ng/L (ref <18)
Troponin I (High Sensitivity): 8 ng/L (ref <18)

## 2021-03-09 LAB — COMPREHENSIVE METABOLIC PANEL
ALT: 16 U/L (ref 0–44)
AST: 18 U/L (ref 15–41)
Albumin: 3.3 g/dL — ABNORMAL LOW (ref 3.5–5.0)
Alkaline Phosphatase: 58 U/L (ref 38–126)
Anion gap: 9 (ref 5–15)
BUN: 21 mg/dL (ref 8–23)
CO2: 23 mmol/L (ref 22–32)
Calcium: 8.8 mg/dL — ABNORMAL LOW (ref 8.9–10.3)
Chloride: 105 mmol/L (ref 98–111)
Creatinine, Ser: 0.6 mg/dL (ref 0.44–1.00)
GFR, Estimated: >60 mL/min (ref >60)
Glucose, Bld: 153 mg/dL — ABNORMAL HIGH (ref 70–99)
Potassium: 3.7 mmol/L (ref 3.5–5.1)
Sodium: 137 mmol/L (ref 135–145)
Total Bilirubin: 0.6 mg/dL (ref 0.3–1.2)
Total Protein: 7 g/dL (ref 6.5–8.1)

## 2021-03-09 MED ORDER — OXYCODONE-ACETAMINOPHEN 5-325 MG PO TABS
1.0000 | ORAL_TABLET | Freq: Once | ORAL | Status: AC
  Administered 2021-03-09: 1 via ORAL
  Filled 2021-03-09: qty 1
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
## 2021-03-10 ENCOUNTER — Telehealth (HOSPITAL_COMMUNITY): Payer: Self-pay | Admitting: Student

## 2021-03-10 ENCOUNTER — Telehealth: Payer: Self-pay

## 2021-03-10 MED ORDER — OXYCODONE-ACETAMINOPHEN 5-325 MG PO TABS: 1.0000 | ORAL_TABLET | Freq: Four times a day (QID) | ORAL | 0 refills | Status: DC | PRN

## 2021-03-10 MED ORDER — BACITRACIN ZINC 500 UNIT/GM EX OINT: 1.0000 "application " | TOPICAL_OINTMENT | Freq: Two times a day (BID) | CUTANEOUS | 0 refills | Status: AC

## 2021-03-10 MED ORDER — IOHEXOL 350 MG/ML SOLN
75.0000 mL | Freq: Once | INTRAVENOUS | Status: AC | PRN
  Administered 2021-03-10: 75 mL via INTRAVENOUS

## 2021-03-10 MED ORDER — OXYCODONE-ACETAMINOPHEN 5-325 MG PO TABS
1.0000 | ORAL_TABLET | Freq: Once | ORAL | Status: AC
  Administered 2021-03-10: 1 via ORAL
  Filled 2021-03-10: qty 1

## 2021-03-10 MED ORDER — OXYCODONE-ACETAMINOPHEN 5-325 MG PO TABS: 1.0000 | ORAL_TABLET | Freq: Four times a day (QID) | ORAL | 0 refills | Status: AC | PRN
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
## 2021-03-10 NOTE — Telephone Encounter (Signed)
Patient daughter called in stating htat the [facility] the oxycodone was called into does not have it, will not be in for several days. They said the [facility] on [address] has them in stock. Messaged PA who prescribed to see if she can change it over electronically.

## 2021-03-10 NOTE — Telephone Encounter (Signed)
Patient had prior percocet prescription sent to pharmacy during ED visit last night, the initial pharmacy does not have this in stock therefore prescription sent to alternative pharmacy per patient request.
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
## 2021-03-20 ENCOUNTER — Ambulatory Visit (INDEPENDENT_AMBULATORY_CARE_PROVIDER_SITE_OTHER): Payer: PRIVATE HEALTH INSURANCE | Admitting: Family Medicine

## 2021-03-20 ENCOUNTER — Other Ambulatory Visit: Payer: Self-pay

## 2021-03-20 VITALS — BP 115/60 | HR 87 | Ht 60.0 in | Wt 123.6 lb

## 2021-03-20 DIAGNOSIS — J029 Acute pharyngitis, unspecified: Secondary | ICD-10-CM

## 2021-03-20 DIAGNOSIS — T22352S Burn of third degree of left shoulder, sequela: Secondary | ICD-10-CM

## 2021-03-20 DIAGNOSIS — T22252D Burn of second degree of left shoulder, subsequent encounter: Secondary | ICD-10-CM

## 2021-03-20 MED ORDER — GABAPENTIN 100 MG PO CAPS: 100.0000 mg | ORAL_CAPSULE | Freq: Three times a day (TID) | ORAL | 1 refills | Status: DC

## 2021-03-20 NOTE — Patient Instructions (Addendum)
I have added a medication called gabapentin to help with the pain in your arm. Please start by taking 100 mg at bedtime as this medication can make you very sleepy so please do not drive while taking this medication if it makes you sleepy.   I have also submitted a referral for wound care to monitor the ulcer on your back.   Please plan to follow up with our clinic once per week for at least the next 2-3 weeks.

## 2021-03-22 DIAGNOSIS — T22252A Burn of second degree of left shoulder, initial encounter: Secondary | ICD-10-CM | POA: Insufficient documentation

## 2021-03-22 DIAGNOSIS — T22352S Burn of third degree of left shoulder, sequela: Secondary | ICD-10-CM | POA: Insufficient documentation

## 2021-03-22 NOTE — Assessment & Plan Note (Signed)
Recommended referral to wound care given the size of ulceration  Recommended daily cleaning and covering with moistened guaze to prevent sticking to wound  Will need weekly follow up with [facility] to monitor healing  Discussed monitoring for worsening of area or development of fevers  Patient to trial gabapentin for neuropathic symptoms

## 2021-03-29 ENCOUNTER — Ambulatory Visit (INDEPENDENT_AMBULATORY_CARE_PROVIDER_SITE_OTHER): Payer: PRIVATE HEALTH INSURANCE | Admitting: Family Medicine

## 2021-03-29 ENCOUNTER — Other Ambulatory Visit: Payer: Self-pay

## 2021-03-29 ENCOUNTER — Encounter (HOSPITAL_BASED_OUTPATIENT_CLINIC_OR_DEPARTMENT_OTHER): Payer: No Typology Code available for payment source | Admitting: Internal Medicine

## 2021-03-29 DIAGNOSIS — T22252D Burn of second degree of left shoulder, subsequent encounter: Secondary | ICD-10-CM

## 2021-03-29 DIAGNOSIS — M5412 Radiculopathy, cervical region: Secondary | ICD-10-CM

## 2021-03-29 MED ORDER — GABAPENTIN 300 MG PO CAPS: 300.0000 mg | ORAL_CAPSULE | Freq: Three times a day (TID) | ORAL | 1 refills | Status: DC

## 2021-03-29 NOTE — Patient Instructions (Addendum)
Take the 100 mg pills during the day.  I sent in a new prescription for 300 mg pills that you take at night. See [place] again in 6 weeks if you are still hurting.

## 2021-03-30 ENCOUNTER — Encounter: Payer: Self-pay | Admitting: Family Medicine
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
## 2021-03-30 NOTE — Assessment & Plan Note (Signed)
Most accurate diagnosis would be neuropathic arm pain.  My big differential would be between cervical nerve root compression (less likely with initial negative CT) and shingles, which in retrospect may explain her minor left arm rash.  Regardless, gabapentin is helping.  Increase dose at night.

## 2021-03-30 NOTE — Assessment & Plan Note (Signed)
Healing very well.  OK to cancel wound care appointment

## 2021-05-14 ENCOUNTER — Encounter: Payer: Self-pay | Admitting: Family Medicine

## 2021-05-14 ENCOUNTER — Ambulatory Visit (INDEPENDENT_AMBULATORY_CARE_PROVIDER_SITE_OTHER): Payer: PRIVATE HEALTH INSURANCE | Admitting: Family Medicine

## 2021-05-14 ENCOUNTER — Other Ambulatory Visit: Payer: Self-pay

## 2021-05-14 DIAGNOSIS — M5412 Radiculopathy, cervical region: Secondary | ICD-10-CM

## 2021-05-14 MED ORDER — GABAPENTIN 300 MG PO CAPS: 300.0000 mg | ORAL_CAPSULE | Freq: Three times a day (TID) | ORAL | 3 refills | Status: DC
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
## 2021-05-14 NOTE — Assessment & Plan Note (Signed)
Decent response with gabapentin but incomplete.  No side effects.  Increase gabapentin to 300 mg tid.
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
## 2022-01-06 ENCOUNTER — Other Ambulatory Visit: Payer: Self-pay | Admitting: Family Medicine

## 2022-01-06 DIAGNOSIS — E039 Hypothyroidism, unspecified: Secondary | ICD-10-CM

## 2022-04-05 ENCOUNTER — Other Ambulatory Visit: Payer: Self-pay | Admitting: Family Medicine

## 2022-04-05 DIAGNOSIS — E039 Hypothyroidism, unspecified: Secondary | ICD-10-CM

## 2022-04-05 DIAGNOSIS — E782 Mixed hyperlipidemia: Secondary | ICD-10-CM

## 2022-04-05 NOTE — Telephone Encounter (Signed)
Please let patient know I am refilling this medication, but she needs to schedule an appointment with me.   Thanks, Ezrah Dembeck J Christina Waldrop, MD  

## 2022-04-29 ENCOUNTER — Ambulatory Visit (INDEPENDENT_AMBULATORY_CARE_PROVIDER_SITE_OTHER): Payer: PRIVATE HEALTH INSURANCE | Admitting: Family Medicine

## 2022-04-29 ENCOUNTER — Encounter: Payer: Self-pay | Admitting: Family Medicine

## 2022-04-29 VITALS — BP 114/51 | HR 64 | Wt 123.2 lb

## 2022-04-29 DIAGNOSIS — E039 Hypothyroidism, unspecified: Secondary | ICD-10-CM

## 2022-04-29 DIAGNOSIS — E782 Mixed hyperlipidemia: Secondary | ICD-10-CM

## 2022-04-29 DIAGNOSIS — M5412 Radiculopathy, cervical region: Secondary | ICD-10-CM

## 2022-04-29 NOTE — Progress Notes (Unsigned)
  Date of Visit: 04/29/2022   SUBJECTIVE:   HPI:  Sara Haas presents today for ***   OBJECTIVE:   BP (!) 114/51   Pulse 64   Wt 123 lb 3.2 oz (55.9 kg)   SpO2 100%   BMI 24.06 kg/m  Gen: *** HEENT: *** Heart: *** Lungs: *** Neuro: *** Ext: ***  ASSESSMENT/PLAN:   Health maintenance:  -***  No problem-specific Assessment & Plan notes found for this encounter.  FOLLOW UP: Follow up in *** for ***  [place] J. Ardelia Mems, [place]

## 2022-04-29 NOTE — Patient Instructions (Signed)
It was great to see you again today!  Call us once you have financial assistance set up so we can order all the items you are due for: - colonoscopy - bone density test - vaccines: flu, COVID, pneumonia  You can get a second shingles shot at your pharmacy  Checking labs today - thyroid, kidneys, cholesterol  Be well, Dr. Ardelia Mems

## 2022-04-30 LAB — BASIC METABOLIC PANEL
BUN/Creatinine Ratio: 30 — ABNORMAL HIGH (ref 12–28)
BUN: 17 mg/dL (ref 8–27)
CO2: 22 mmol/L (ref 20–29)
Calcium: 8.9 mg/dL (ref 8.7–10.3)
Chloride: 103 mmol/L (ref 96–106)
Creatinine, Ser: 0.56 mg/dL — ABNORMAL LOW (ref 0.57–1.00)
Glucose: 103 mg/dL — ABNORMAL HIGH (ref 70–99)
Potassium: 4.7 mmol/L (ref 3.5–5.2)
Sodium: 139 mmol/L (ref 134–144)
eGFR: 99 mL/min/{1.73_m2} (ref >59)

## 2022-04-30 LAB — LIPID PANEL
Chol/HDL Ratio: 3 ratio (ref 0.0–4.4)
Cholesterol, Total: 136 mg/dL (ref 100–199)
HDL: 45 mg/dL (ref >39)
LDL Chol Calc (NIH): 73 mg/dL (ref 0–99)
Triglycerides: 94 mg/dL (ref 0–149)
VLDL Cholesterol Cal: 18 mg/dL (ref 5–40)

## 2022-04-30 LAB — TSH: TSH: 4.39 u[IU]/mL (ref 0.450–4.500)

## 2022-04-30 NOTE — Assessment & Plan Note (Signed)
Improved with gabapentin, refill today.

## 2022-04-30 NOTE — Assessment & Plan Note (Signed)
Check lipids and BMP today, continue statin

## 2022-04-30 NOTE — Assessment & Plan Note (Signed)
Check TSH.  Continue Synthroid, titrated as needed based on TSH.

## 2022-05-03 ENCOUNTER — Other Ambulatory Visit: Payer: Self-pay | Admitting: Family Medicine

## 2022-05-03 ENCOUNTER — Encounter: Payer: Self-pay | Admitting: Family Medicine

## 2022-05-03 DIAGNOSIS — E782 Mixed hyperlipidemia: Secondary | ICD-10-CM

## 2022-05-03 DIAGNOSIS — M5412 Radiculopathy, cervical region: Secondary | ICD-10-CM

## 2022-05-03 DIAGNOSIS — E039 Hypothyroidism, unspecified: Secondary | ICD-10-CM

## 2022-05-03 MED ORDER — GABAPENTIN 300 MG PO CAPS
300.0000 mg | ORAL_CAPSULE | Freq: Three times a day (TID) | ORAL | 3 refills | Status: DC
  Filled 2022-08-27: qty 90, 30d supply, fill #0
  Filled 2022-09-24: qty 270, 90d supply, fill #1
  Filled 2022-12-24 – 2023-01-09 (×2): qty 270, 90d supply, fill #2
  Filled 2023-05-01: qty 90, 30d supply, fill #3

## 2022-05-03 MED ORDER — PRAVASTATIN SODIUM 20 MG PO TABS
20.0000 mg | ORAL_TABLET | Freq: Every day | ORAL | 3 refills | Status: DC
  Filled 2022-08-27: qty 30, 30d supply, fill #0
  Filled 2022-09-24: qty 90, 90d supply, fill #1
  Filled 2022-12-24 – 2023-01-09 (×2): qty 90, 90d supply, fill #2
  Filled 2023-05-01: qty 60, 60d supply, fill #3

## 2022-05-03 MED ORDER — LEVOTHYROXINE SODIUM 75 MCG PO TABS
75.0000 ug | ORAL_TABLET | Freq: Every day | ORAL | 3 refills | Status: DC
  Filled 2022-08-27: qty 30, 30d supply, fill #0
  Filled 2022-09-24: qty 90, 90d supply, fill #1
  Filled 2022-12-24 – 2023-01-09 (×2): qty 90, 90d supply, fill #2
  Filled 2023-05-01 (×2): qty 30, 30d supply, fill #3

## 2022-07-01 ENCOUNTER — Telehealth: Payer: Self-pay

## 2022-07-01 NOTE — Telephone Encounter (Signed)
Telephoned patient using interpreter#[id number]. Left a voice message with [facility] (scholarship) contact information.
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
## 2022-08-13 IMAGING — CT CT ANGIO HEAD-NECK (W OR W/O PERF)
1 of 14 series · 4 of 33 positions shown · IV contrast (omnipaque)
Comparison: CTA head 01/13/2015 and 11/07/2017

CLINICAL DATA: Headache and lightheadedness

EXAM:
CT ANGIOGRAPHY HEAD AND NECK
TECHNIQUE: Multidetector CT imaging of the head and neck was performed using
the standard protocol during bolus administration of intravenous
contrast. Multiplanar CT image reconstructions and MIPs were
obtained to evaluate the vascular anatomy. Carotid stenosis
measurements (when applicable) are obtained utilizing NASCET
criteria, using the distal internal carotid diameter as the
denominator.
CONTRAST:  75mL OMNIPAQUE IOHEXOL 350 MG/ML SOLN

[Series 10: cta neck thins · axial · 0.39mm/px · z∈[+764,+956]mm · 4 of 801 slices shown]
[im 161/801  soft-tissue]
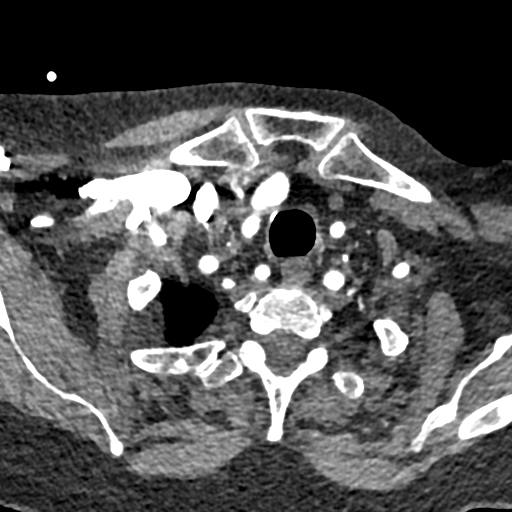
[im 321/801  bone]
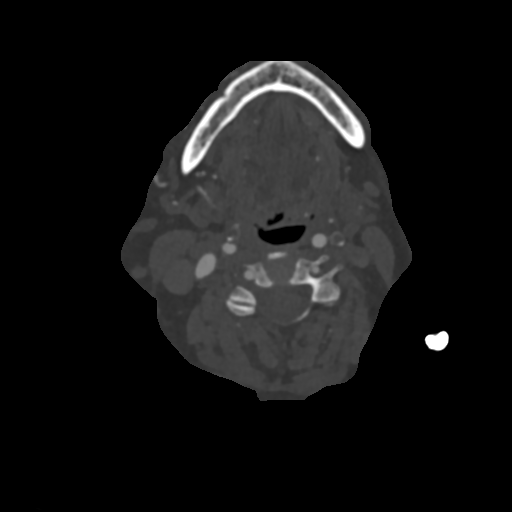
[im 481/801  soft-tissue]
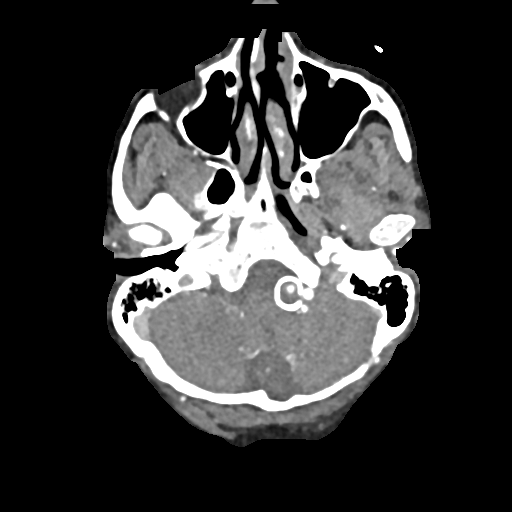
[im 641/801  bone]
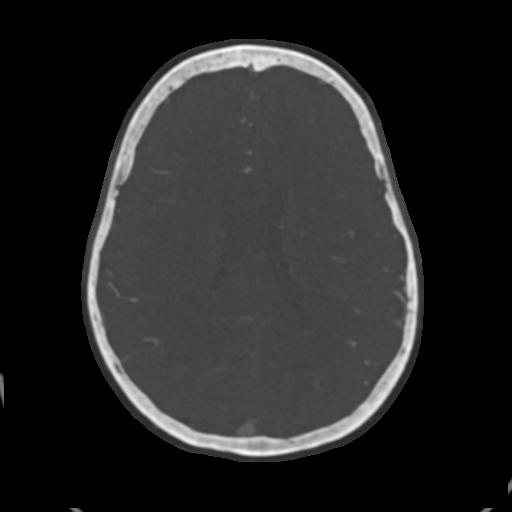

[4 of 33 positions shown; findings below may reference images not displayed]

FINDINGS: CT HEAD FINDINGS

Brain: There is no mass, hemorrhage or extra-axial collection. The
size and configuration of the ventricles and extra-axial CSF spaces
are normal. There is no acute or chronic infarction. The brain
parenchyma is normal.

Skull: The visualized skull base, calvarium and extracranial soft
tissues are normal.

Sinuses/Orbits: No fluid levels or advanced mucosal thickening of
the visualized paranasal sinuses. No mastoid or middle ear effusion.
The orbits are normal.

CTA NECK FINDINGS

SKELETON: There is no bony spinal canal stenosis. No lytic or
blastic lesion.

OTHER NECK: Normal pharynx, larynx and major salivary glands. No
cervical lymphadenopathy. Unremarkable thyroid gland.

UPPER CHEST: No pneumothorax or pleural effusion. No nodules or
masses.

AORTIC ARCH:

There is calcific atherosclerosis of the aortic arch. There is no
aneurysm, dissection or hemodynamically significant stenosis of the
visualized portion of the aorta. Conventional 3 vessel aortic
branching pattern. The visualized proximal subclavian arteries are
widely patent.

RIGHT CAROTID SYSTEM: Normal without aneurysm, dissection or
stenosis.

LEFT CAROTID SYSTEM: Normal without aneurysm, dissection or
stenosis.

VERTEBRAL ARTERIES: Left dominant configuration. Both origins are
clearly patent. There is no dissection, occlusion or flow-limiting
stenosis to the skull base (V1-V3 segments).

CTA HEAD FINDINGS

POSTERIOR CIRCULATION:

--Vertebral arteries: There is a peripherally calcified right
vertebral artery aneurysm, that measures 12 x 9 mm. The enhancing
portion measures 6 x 4 mm.

--Inferior cerebellar arteries: Normal.

--Basilar artery: Normal.

--Superior cerebellar arteries: Normal.

--Posterior cerebral arteries (PCA): Normal.

ANTERIOR CIRCULATION:

--Intracranial internal carotid arteries: Normal.

--Anterior cerebral arteries (ACA): Normal. Both A1 segments are
present. Patent anterior communicating artery (a-comm).

--Middle cerebral arteries (MCA): Normal.

VENOUS SINUSES: As permitted by contrast timing, patent.

ANATOMIC VARIANTS: None

Review of the MIP images confirms the above findings.
IMPRESSION: 1. No emergent large vessel occlusion or high-grade stenosis.
2. 12 x 9 mm peripherally calcified right vertebral artery aneurysm.

Aortic Atherosclerosis (8EQTD-IME.E).

## 2022-08-19 ENCOUNTER — Other Ambulatory Visit: Payer: Self-pay

## 2022-08-21 ENCOUNTER — Other Ambulatory Visit: Payer: Self-pay

## 2022-08-22 ENCOUNTER — Other Ambulatory Visit: Payer: Self-pay

## 2022-08-27 ENCOUNTER — Other Ambulatory Visit: Payer: Self-pay

## 2022-09-24 ENCOUNTER — Other Ambulatory Visit: Payer: Self-pay

## 2022-09-26 ENCOUNTER — Other Ambulatory Visit: Payer: Self-pay

## 2022-12-24 ENCOUNTER — Other Ambulatory Visit: Payer: Self-pay

## 2022-12-31 ENCOUNTER — Other Ambulatory Visit: Payer: Self-pay

## 2023-01-09 ENCOUNTER — Other Ambulatory Visit: Payer: Self-pay

## 2023-01-13 ENCOUNTER — Other Ambulatory Visit: Payer: Self-pay

## 2023-01-24 ENCOUNTER — Other Ambulatory Visit: Payer: Self-pay | Admitting: Family Medicine

## 2023-01-24 DIAGNOSIS — Z1211 Encounter for screening for malignant neoplasm of colon: Secondary | ICD-10-CM

## 2023-01-24 DIAGNOSIS — Z1212 Encounter for screening for malignant neoplasm of rectum: Secondary | ICD-10-CM

## 2023-05-01 ENCOUNTER — Other Ambulatory Visit: Payer: Self-pay

## 2023-05-07 ENCOUNTER — Other Ambulatory Visit: Payer: Self-pay

## 2023-06-25 ENCOUNTER — Other Ambulatory Visit: Payer: Self-pay

## 2023-06-25 ENCOUNTER — Other Ambulatory Visit: Payer: Self-pay | Admitting: Family Medicine

## 2023-06-25 DIAGNOSIS — E782 Mixed hyperlipidemia: Secondary | ICD-10-CM

## 2023-06-25 DIAGNOSIS — M5412 Radiculopathy, cervical region: Secondary | ICD-10-CM

## 2023-06-25 DIAGNOSIS — E039 Hypothyroidism, unspecified: Secondary | ICD-10-CM

## 2023-06-30 ENCOUNTER — Other Ambulatory Visit: Payer: Self-pay

## 2023-06-30 MED ORDER — LEVOTHYROXINE SODIUM 75 MCG PO TABS
75.0000 ug | ORAL_TABLET | Freq: Every day | ORAL | 0 refills | Status: DC
  Filled 2023-06-30: qty 90, 90d supply, fill #0

## 2023-06-30 MED ORDER — GABAPENTIN 300 MG PO CAPS
300.0000 mg | ORAL_CAPSULE | Freq: Three times a day (TID) | ORAL | 0 refills | Status: DC
  Filled 2023-06-30: qty 270, 90d supply, fill #0

## 2023-06-30 MED ORDER — PRAVASTATIN SODIUM 20 MG PO TABS
20.0000 mg | ORAL_TABLET | Freq: Every day | ORAL | 0 refills | Status: DC
  Filled 2023-06-30: qty 30, 30d supply, fill #0

## 2023-06-30 NOTE — Telephone Encounter (Signed)
Please let patient know I am refilling this medication, but she needs to schedule an appointment with me.   Thanks, Leeanne Rio, MD

## 2023-07-01 ENCOUNTER — Other Ambulatory Visit: Payer: Self-pay

## 2023-07-21 ENCOUNTER — Ambulatory Visit (INDEPENDENT_AMBULATORY_CARE_PROVIDER_SITE_OTHER): Payer: PRIVATE HEALTH INSURANCE | Admitting: Family Medicine

## 2023-07-21 VITALS — BP 104/68 | HR 72 | Ht 60.0 in | Wt 128.6 lb

## 2023-07-21 DIAGNOSIS — Z1231 Encounter for screening mammogram for malignant neoplasm of breast: Secondary | ICD-10-CM

## 2023-07-21 DIAGNOSIS — E782 Mixed hyperlipidemia: Secondary | ICD-10-CM

## 2023-07-21 DIAGNOSIS — Z0000 Encounter for general adult medical examination without abnormal findings: Secondary | ICD-10-CM

## 2023-07-21 DIAGNOSIS — M792 Neuralgia and neuritis, unspecified: Secondary | ICD-10-CM

## 2023-07-21 DIAGNOSIS — Z1211 Encounter for screening for malignant neoplasm of colon: Secondary | ICD-10-CM

## 2023-07-21 DIAGNOSIS — L989 Disorder of the skin and subcutaneous tissue, unspecified: Secondary | ICD-10-CM

## 2023-07-21 DIAGNOSIS — E039 Hypothyroidism, unspecified: Secondary | ICD-10-CM

## 2023-07-21 DIAGNOSIS — E2839 Other primary ovarian failure: Secondary | ICD-10-CM

## 2023-07-21 DIAGNOSIS — M5412 Radiculopathy, cervical region: Secondary | ICD-10-CM

## 2023-07-21 DIAGNOSIS — Z23 Encounter for immunization: Secondary | ICD-10-CM

## 2023-07-21 NOTE — Assessment & Plan Note (Signed)
 Update TSH today, titrate levothyroxine pending results

## 2023-07-21 NOTE — Assessment & Plan Note (Signed)
 Vaccines updated today: PCV 20, flu, COVID Mammogram and DEXA scans ordered, given instructions on how to schedule Discussed options for colon cancer screening, desires colonoscopy, referral placed

## 2023-07-21 NOTE — Patient Instructions (Addendum)
 It was great to see you again today.  Checking labwork today  Flu, COVID, and pneumonia shots today.  To schedule your mammogram and bone density test, call the [facility] at [phone]. They are located at [address].    Will send refills once have lab results  Be well, Dr. Pollie Meyer
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
## 2023-07-21 NOTE — Assessment & Plan Note (Signed)
 Fasting today, lipid panel obtained, continue statin, titrate pending results

## 2023-07-22 ENCOUNTER — Encounter: Payer: Self-pay | Admitting: Family Medicine

## 2023-07-22 LAB — BASIC METABOLIC PANEL WITH GFR
BUN/Creatinine Ratio: 25 (ref 12–28)
BUN: 14 mg/dL (ref 8–27)
CO2: 23 mmol/L (ref 20–29)
Calcium: 9.3 mg/dL (ref 8.7–10.3)
Chloride: 102 mmol/L (ref 96–106)
Creatinine, Ser: 0.57 mg/dL (ref 0.57–1.00)
Glucose: 94 mg/dL (ref 70–99)
Potassium: 4.7 mmol/L (ref 3.5–5.2)
Sodium: 140 mmol/L (ref 134–144)
eGFR: 98 mL/min/{1.73_m2} (ref >59)

## 2023-07-22 LAB — LIPID PANEL
Chol/HDL Ratio: 3.1 ratio (ref 0.0–4.4)
Cholesterol, Total: 156 mg/dL (ref 100–199)
HDL: 50 mg/dL (ref >39)
LDL Chol Calc (NIH): 76 mg/dL (ref 0–99)
Triglycerides: 176 mg/dL — ABNORMAL HIGH (ref 0–149)
VLDL Cholesterol Cal: 30 mg/dL (ref 5–40)

## 2023-07-22 LAB — TSH: TSH: 1.45 u[IU]/mL (ref 0.450–4.500)

## 2023-07-22 MED ORDER — GABAPENTIN 300 MG PO CAPS: 300.0000 mg | ORAL_CAPSULE | Freq: Three times a day (TID) | ORAL | 2 refills | Status: DC

## 2023-07-22 MED ORDER — PRAVASTATIN SODIUM 20 MG PO TABS: 20.0000 mg | ORAL_TABLET | Freq: Every day | ORAL | 3 refills | Status: DC

## 2023-07-22 MED ORDER — LEVOTHYROXINE SODIUM 75 MCG PO TABS: 75.0000 ug | ORAL_TABLET | Freq: Every day | ORAL | 3 refills | Status: DC

## 2023-07-22 NOTE — Addendum Note (Signed)
 Addended by: Latrelle Dodrill on: 07/22/2023 08:34 AM   Modules accepted: Orders

## 2023-07-28 ENCOUNTER — Telehealth: Payer: Self-pay

## 2023-07-28 NOTE — Telephone Encounter (Signed)
 Fine with me, thanks! Latrelle Dodrill, MD

## 2023-07-28 NOTE — Telephone Encounter (Signed)
 Received call from pharmacy regarding patient's levothyroxine prescription.   [facility] is having a manufacturer change and they need okay from the provider to make the change.   Will forward to provider for review. If okay to proceed with change, I can call back to the pharmacy with verbal approval. Thanks.   Veronda Prude, RN

## 2023-07-28 NOTE — Telephone Encounter (Signed)
 Called pharmacy and provided with verbal okay to change manufacturer.    Veronda Prude, RN

## 2023-07-31 ENCOUNTER — Telehealth: Payer: Self-pay

## 2023-07-31 ENCOUNTER — Other Ambulatory Visit: Payer: Self-pay

## 2023-07-31 DIAGNOSIS — M5412 Radiculopathy, cervical region: Secondary | ICD-10-CM

## 2023-07-31 DIAGNOSIS — E782 Mixed hyperlipidemia: Secondary | ICD-10-CM

## 2023-07-31 MED ORDER — PRAVASTATIN SODIUM 20 MG PO TABS
20.0000 mg | ORAL_TABLET | Freq: Every day | ORAL | 3 refills | Status: AC
  Filled 2023-07-31: qty 90, 90d supply, fill #0
  Filled 2023-10-28: qty 30, 30d supply, fill #1
  Filled 2023-11-27: qty 30, 30d supply, fill #2
  Filled 2023-12-31: qty 90, 90d supply, fill #3
  Filled 2024-03-25: qty 90, 90d supply, fill #4

## 2023-07-31 MED ORDER — GABAPENTIN 300 MG PO CAPS
300.0000 mg | ORAL_CAPSULE | Freq: Three times a day (TID) | ORAL | 2 refills | Status: AC
  Filled 2023-07-31 – 2023-10-07 (×2): qty 270, 90d supply, fill #0
  Filled 2023-12-31: qty 270, 90d supply, fill #1

## 2023-07-31 NOTE — Telephone Encounter (Signed)
 Rx sent Latrelle Dodrill, MD

## 2023-07-31 NOTE — Telephone Encounter (Signed)
 Patients daughter calls nurse line requesting pharmacy change.   She reports she uses [facility]. She reports she no longer uses [facility].   I called [facility] and cancelled Gabapentin and Pravastatin.   Please resend to [facility].

## 2023-08-04 ENCOUNTER — Other Ambulatory Visit: Payer: Self-pay

## 2023-10-07 ENCOUNTER — Other Ambulatory Visit: Payer: Self-pay

## 2023-10-28 ENCOUNTER — Other Ambulatory Visit: Payer: Self-pay

## 2023-10-28 ENCOUNTER — Other Ambulatory Visit: Payer: Self-pay | Admitting: Family Medicine

## 2023-10-28 DIAGNOSIS — E039 Hypothyroidism, unspecified: Secondary | ICD-10-CM

## 2023-10-29 ENCOUNTER — Other Ambulatory Visit: Payer: Self-pay

## 2023-10-29 MED ORDER — LEVOTHYROXINE SODIUM 75 MCG PO TABS
75.0000 ug | ORAL_TABLET | Freq: Every day | ORAL | 3 refills | Status: AC
  Filled 2023-10-29: qty 90, 90d supply, fill #0
  Filled 2024-02-02 (×2): qty 90, 90d supply, fill #1
  Filled 2024-04-30 – 2024-05-05 (×2): qty 90, 90d supply, fill #2

## 2023-11-15 ENCOUNTER — Emergency Department (HOSPITAL_COMMUNITY)
Admission: EM | Admit: 2023-11-15 | Discharge: 2023-11-16 | Disposition: A | Discharge status: Home / Self Care | Attending: Emergency Medicine | Admitting: Emergency Medicine

## 2023-11-15 DIAGNOSIS — Y92009 Unspecified place in unspecified non-institutional (private) residence as the place of occurrence of the external cause: Secondary | ICD-10-CM | POA: Insufficient documentation

## 2023-11-15 DIAGNOSIS — W010XXA Fall on same level from slipping, tripping and stumbling without subsequent striking against object, initial encounter: Secondary | ICD-10-CM | POA: Insufficient documentation

## 2023-11-15 DIAGNOSIS — Z7982 Long term (current) use of aspirin: Secondary | ICD-10-CM | POA: Insufficient documentation

## 2023-11-15 DIAGNOSIS — J45909 Unspecified asthma, uncomplicated: Secondary | ICD-10-CM | POA: Insufficient documentation

## 2023-11-15 DIAGNOSIS — S0101XA Laceration without foreign body of scalp, initial encounter: Secondary | ICD-10-CM | POA: Insufficient documentation

## 2023-11-16 ENCOUNTER — Other Ambulatory Visit: Payer: Self-pay

## 2023-11-16 ENCOUNTER — Emergency Department (HOSPITAL_COMMUNITY)

## 2023-11-16 ENCOUNTER — Encounter (HOSPITAL_COMMUNITY): Payer: Self-pay | Admitting: *Deleted

## 2023-11-16 MED ORDER — ACETAMINOPHEN 325 MG PO TABS
650.0000 mg | ORAL_TABLET | Freq: Once | ORAL | Status: AC
  Administered 2023-11-16: 650 mg via ORAL
  Filled 2023-11-16: qty 2

## 2023-11-16 MED ORDER — IBUPROFEN 400 MG PO TABS
600.0000 mg | ORAL_TABLET | Freq: Once | ORAL | Status: AC
  Administered 2023-11-16: 600 mg via ORAL
  Filled 2023-11-16: qty 1

## 2023-11-16 MED ORDER — TETANUS-DIPHTH-ACELL PERTUSSIS 5-2.5-18.5 LF-MCG/0.5 IM SUSY: 0.5000 mL | PREFILLED_SYRINGE | Freq: Once | INTRAMUSCULAR | Status: DC

## 2023-11-16 NOTE — ED Provider Notes (Signed)
 [place] EMERG[facility] DEPARTMENT AT [facility] Provider Note   CSN: [id number] Arrival date & time: 11/15/23  2355     Patient presents with: [place]   Sara Haas is a 68 y.o. female.   The history is provided by the patient. A language interpreter was used [facility] [phone]).  Fall   She has history of hyperlipidemia, asthma, right vertebral artery aneurysm and comes in following a fall at home.  She tripped and hit the top of her head on some siding and suffered a laceration.  She denies loss of consciousness.  She endorses a lot of bleeding.  She washed the area with a hose and then applied onion to get the bleeding to stop.  She is not sure when her last tetanus immunization was.  She denies other injury.    Prior to Admission medications   Medication Sig Start Date End Date Taking? Authorizing Provider  aspirin  81 MG chewable tablet Chew 4 tablets (324 mg total) by mouth daily. Patient taking differently: Chew 81 mg by mouth daily. 08/15/14   Jamey Mardy LABOR, MD  bacitracin  ointment Apply 1 application topically 2 (two) times daily. 03/10/21   Petrucelli, Samantha R, PA-C  gabapentin  (NEURONTIN ) 300 MG capsule Take 1 capsule (300 mg total) by mouth 3 (three) times daily. 07/31/23   Donah Laymon PARAS, MD  levothyroxine  (SYNTHROID ) 75 MCG tablet Take 1 tablet (75 mcg total) by mouth daily. 10/29/23   Donah Laymon PARAS, MD  Multiple Vitamin (MULTIVITAMIN WITH MINERALS) TABS tablet Take 1 tablet by mouth daily.    [provider]  pravastatin  (PRAVACHOL ) 20 MG tablet Take 1 tablet (20 mg total) by mouth at bedtime. 07/31/23   Donah Laymon PARAS, MD    Allergies: Patient has no known allergies.    Review of Systems  All other systems reviewed and are negative.   Updated Vital Signs BP (!) 154/81 (BP Location: Right Arm)   Pulse 67   Temp 98.2 F (36.8 C)   Resp 18   Ht 5' (1.524 m)   Wt 58.3 kg   SpO2 97%   BMI 25.10 kg/m   Physical  Exam Vitals and nursing note reviewed.   68 year old female, resting comfortably and in no acute distress. Vital signs are significant for elevated blood pressure. Oxygen saturation is 97%, which is normal. Head is normocephalic.  There is a laceration of the vertex of the scalp with no active bleeding. PERRLA, EOMI.  Neck is nontender. Back is nontender. Lungs are clear without rales, wheezes, or rhonchi. Chest is nontender. Heart has regular rate and rhythm without murmur. Abdomen is soft, flat, nontender. Extremities have no swelling or deformity, full passive range of motion present all joints without pain. Skin is warm and dry without rash. Neurologic: Awake and alert and oriented, cranial nerves are intact, moves all extremities equally.  (all labs ordered are listed, but only abnormal results are displayed) Labs Reviewed - No data to display  EKG: EKG Interpretation Date/Time:  Sunday November 16 2023 00:07:51 EDT Ventricular Rate:  70 PR Interval:  140 QRS Duration:  82 QT Interval:  450 QTC Calculation: 486 R Axis:   -9  Text Interpretation: Normal sinus rhythm Nonspecific T wave abnormality Abnormal ECG When compared with ECG of 09-Mar-2021 18:53, No significant change was found Confirmed by Raford Lenis ([id number]) on 11/16/2023 12:13:15 AM  Radiology: CT Head Wo Contrast Result Date: 11/16/2023 CLINICAL DATA:  Head trauma, minor (Age >= 65y).  Fall EXAM: CT HEAD WITHOUT CONTRAST TECHNIQUE: Contiguous axial images were obtained from the base of the skull through the vertex without intravenous contrast. RADIATION DOSE REDUCTION: This exam was performed according to the departmental dose-optimization program which includes automated exposure control, adjustment of the mA and/or kV according to patient size and/or use of iterative reconstruction technique. 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 No other intra or  extra-axial mass lesion identified. No acute intracranial hemorrhage or infarct. No abnormal mass effect or midline shift. Ventricular size is normal. Cerebellum is unremarkable. Vascular: No asymmetric hyperdense vasculature at the skull base. Skull: Normal. Negative for fracture or focal lesion. Sinuses/Orbits: No acute finding. Other: Mastoid air cells and middle ear cavities are clear. IMPRESSION: 1. No acute intracranial abnormality. 2. 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 Electronically Signed   By: Dorethia Molt M.D.   On: 11/16/2023 02:24   CT Cervical Spine Wo Contrast Result Date: 11/16/2023 CLINICAL DATA:  Neck trauma (Age >= 65y) EXAM: CT CERVICAL SPINE WITHOUT CONTRAST TECHNIQUE: Multidetector CT imaging of the cervical spine was performed without intravenous contrast. Multiplanar CT image reconstructions were also generated. RADIATION DOSE REDUCTION: This exam was performed according to the departmental dose-optimization program which includes automated exposure control, adjustment of the mA and/or kV according to patient size and/or use of iterative reconstruction technique. COMPARISON:  CT cervical spine March 10, 2021. FINDINGS: Alignment: No substantial sagittal subluxation. Skull base and vertebrae: No acute fracture. No primary bone lesion or focal pathologic process. Soft tissues and spinal canal: No prevertebral fluid or swelling. No visible canal hematoma. Disc levels: No substantial bony degenerative change. Upper chest: Visualized lung apices are clear. 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 IMPRESSION: 1. No evidence of acute fracture or traumatic malalignment. 2. 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 Electronically Signed   By: Gilmore GORMAN Molt M.D.   On: 11/16/2023 02:17     .Laceration Repair  Date/Time: 11/16/2023 3:20  AM  Performed by: Raford Lenis, MD Authorized by: Raford Lenis, MD   Consent:    Consent obtained:  Verbal   Consent given by:  Patient   Risks, benefits, and alternatives were discussed: yes     Risks discussed:  Pain and infection   Alternatives discussed:  No treatment Universal protocol:    Procedure explained and questions answered to patient or proxy's satisfaction: yes     Relevant documents present and verified: yes     Test results available: yes     Imaging studies available: yes     Required blood products, implants, devices, and special equipment available: yes     Site/side marked: yes     Immediately prior to procedure, a time out was called: yes     Patient identity confirmed:  Verbally with patient and arm band Anesthesia:    Anesthesia method:  None Laceration details:    Location:  Scalp   Scalp location:  Crown   Length (cm):  3.5   Depth (mm):  4 Pre-procedure details:    Preparation:  Patient was prepped and draped in usual sterile fashion and imaging obtained to evaluate for foreign bodies Exploration:    Limited defect created (wound extended): no     Hemostasis achieved with:  Direct pressure   Imaging obtained: x-ray     Imaging outcome: foreign body not noted  Wound exploration: entire depth of wound visualized     Wound extent: no foreign body, no underlying fracture and no vascular damage     Contaminated: no   Treatment:    Area cleansed with:  Saline   Amount of cleaning:  Standard   Debridement:  None   Undermining:  None   Scar revision: no   Skin repair:    Repair method:  Staples   Number of staples:  7 Approximation:    Approximation:  Close Repair type:    Repair type:  Simple Post-procedure details:    Dressing:  Open (no dressing)   Procedure completion:  Tolerated well, no immediate complications    Medications Ordered in the ED - No data to display                                  Medical Decision Making Amount  and/or Complexity of Data Reviewed Radiology: ordered.  Risk OTC drugs.   Fall with scalp laceration.  I have reviewed her past records, and note Tdap administered on 08/17/2019-she does not need to be updated on tetanus.  CT angiogram of the head and neck on 03/09/2021 showed a 12 x 9 mm peripherally calcified right vertebral artery aneurysm.  I have ordered CT of head and cervical spine.  I have reviewed her electrocardiogram, and my interpretation is nonspecific T wave changes unchanged from prior.  CT scan shows no acute abnormality, stable 14 mm calcified distal left vertebral artery aneurysm, no C-spine injury.  I have independently viewed all of the images, and agree with the radiologist's interpretation.  I have closed the laceration with staples and I am discharging her with instructions to have staples removed in 7 days.     Final diagnoses:  Fall from slip, trip, or stumble, initial encounter  Scalp laceration, initial encounter    ED Discharge Orders     None          Raford Lenis, MD 11/16/23 [phone]

## 2023-11-16 NOTE — ED Triage Notes (Signed)
 The pt fell at home outside around 2200 she tripped and struck the top of her head on the side of the house  laceration in her scalp no bleeding at present.  Not on thinners  she was feeling dizzy earlier  she took tylenol  for the pain but the pain is getting worse

## 2023-11-16 NOTE — Discharge Instructions (Addendum)
 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

## 2023-11-24 ENCOUNTER — Ambulatory Visit: Admitting: Family Medicine

## 2023-11-24 ENCOUNTER — Encounter: Payer: Self-pay | Admitting: Family Medicine

## 2023-11-24 VITALS — BP 130/92 | HR 73 | Ht 60.0 in | Wt 126.0 lb

## 2023-11-24 DIAGNOSIS — Z5986 Financial insecurity: Secondary | ICD-10-CM

## 2023-11-24 DIAGNOSIS — S0101XS Laceration without foreign body of scalp, sequela: Secondary | ICD-10-CM

## 2023-11-24 DIAGNOSIS — Z9181 History of falling: Secondary | ICD-10-CM

## 2023-11-24 NOTE — Progress Notes (Signed)
    SUBJECTIVE:   CHIEF COMPLAINT / HPI:   Emergency visit f/u  Patient recently went to the emergency room on 11/15/2023 after a fall.  She sustained a scalp laceration that was stapled at that time.  Patient and daughter mention that fall was mechanical.  Patient tripped over a elevated ridge in a doorway that goes to the outside of her house.  Patient says that she has had falls before however none recently she is not able to remember the last time that she fell.  Patient's daughter says that she has not fallen recently to her knowledge.  She is usually quite active and goes on walks 2-3 times a day for at least a mile each.  Patient does all of her ADLs by herself.  However says that since the fall she has been a little bit nervous and feels like she is worried she might lose her balance.  Denies any prodrome to her fall.  Denies lightheadedness or dizziness.  Denies heart palpitations.  Denies any increased pain around her scalp laceration or bleeding or discharge.  Says that it is a little bit itchy and she sometimes feels pulsing around that area but no headaches.  Denies any other neurologic symptoms.  PERTINENT  PMH / PSH: Vertebral artery aneurysm, hypothyroidism   OBJECTIVE:   BP (!) 130/92   Pulse 73   Ht 5' (1.524 m)   Wt 126 lb (57.2 kg)   SpO2 99%   BMI 24.61 kg/m   General: well appearing, in no acute distress CV: RRR, radial pulses equal and palpable, no BLE edema  Resp: Normal work of breathing on room air, CTAB Abd: Soft, non tender, non distended  Neuro: Alert & Oriented x 4, PERRLA, EOMI, CN2-12 grossly intact, UE strength 5/5, LE strength 5/5, normal gait, no truncal ataxia  Scalp: 7 staples with dried blood, no edema, erythema, or warmth    ASSESSMENT/PLAN:   Assessment & Plan Scalp laceration, sequela Scalp laceration appeared well-healed today.  I removed the 7 staples using a staple remover.  Patient did not have any concerns during the procedure. - Counseled  the patient that she can wash her hair now and to to dry the area.  Informed that if she had any swelling, pain, drainage that she should follow-up. History of recent fall Recent fall seems to be mechanical in nature unlikely that there was a cardiac component.  Unlikely that patient has neuropathy contributing to the fall.  However she does feel more uneasy now and as concerns regarding her balance. - Would recommend physical therapy to increase strength and balance however patient has discount card for her insurance coverage and this would be expensive for her.  They have been working on [facility] assistance application for the last 6 months however and have not heard back. - Reviewed some exercises that patient can do to improve balance, will work on them with her daughter.  She will also continue her evening walks with her other daughter. Financially insecure Patient's application for [facility] financial assistance has been difficult and patient and patient's daughter have not heard back in the last 6 months.  This is getting in the way of patient's care as she is not able to get the proper cancer screenings or treatment for above due to this discrepancy. - Referral to social work for assistance with application.     Areta Saliva, MD [facility]

## 2023-11-24 NOTE — Patient Instructions (Addendum)
 It was wonderful to see you today.  Please bring ALL of your medications with you to every visit.   Today we talked about:  Scalp laceration - You did great and are so brave! All of your stapels were removed. You can wash your hair as normal. If you have any worsening pain or swelling let us  know but I do not expect any problems.   I will put in a referral for social work to contact you regarding [facility] financial aid.   Please continue walking and doing exercises at home in the mean time.   Thank you for choosing [facility].   Please call [phone] with any questions about today's appointment.  Areta Saliva, MD  Family Medicine     Fue un placer verte hoy.  Por favor, trae TODOS tus medicamentos a cada visita.  Hoy hablamos sobre:  Laceracin del cuero cabelludo: Lo hiciste muy bien y eres muy valiente! Te quitaron todas las grapas. Puedes lavarte el cabello como siempre. Si el dolor o la inflamacin empeoran, avsanos, pero no espero ningn problema.  Solicitar una derivacin a Passenger transport manager social para que se ponga en contacto contigo en relacin con la ayuda financiera de [facility].  Mientras tanto, sigue caminando y haciendo ejercicios en casa.  Kathlynn por elegir [facility].  Ezella al [phone] si tienes alguna pregunta sobre la [place] de [place].  Areta Saliva, MD [facility]

## 2023-11-27 ENCOUNTER — Other Ambulatory Visit: Payer: Self-pay

## 2023-11-28 ENCOUNTER — Other Ambulatory Visit: Payer: Self-pay

## 2023-12-31 ENCOUNTER — Other Ambulatory Visit: Payer: Self-pay

## 2024-02-02 ENCOUNTER — Other Ambulatory Visit: Payer: Self-pay

## 2024-02-04 ENCOUNTER — Other Ambulatory Visit: Payer: Self-pay

## 2024-03-25 ENCOUNTER — Other Ambulatory Visit: Payer: Self-pay

## 2024-03-31 ENCOUNTER — Other Ambulatory Visit: Payer: Self-pay

## 2024-04-30 ENCOUNTER — Other Ambulatory Visit (HOSPITAL_COMMUNITY): Payer: Self-pay

## 2024-04-30 ENCOUNTER — Other Ambulatory Visit: Payer: Self-pay

## 2024-05-05 ENCOUNTER — Other Ambulatory Visit (HOSPITAL_COMMUNITY): Payer: Self-pay

## 2024-05-05 ENCOUNTER — Other Ambulatory Visit: Payer: Self-pay

## 2024-05-11 ENCOUNTER — Other Ambulatory Visit: Payer: Self-pay
